# Patient Record
Sex: Female | Born: 1935 | Race: White | Hispanic: No | Marital: Married | Smoking: Never smoker
Health system: Southern US, Community
[De-identification: ages and names within clinical notes are randomized; demographics above are authoritative.]

## PROBLEM LIST (undated history)

## (undated) DIAGNOSIS — I10 Essential (primary) hypertension: Secondary | ICD-10-CM

## (undated) DIAGNOSIS — K219 Gastro-esophageal reflux disease without esophagitis: Secondary | ICD-10-CM

## (undated) DIAGNOSIS — I4891 Unspecified atrial fibrillation: Secondary | ICD-10-CM

## (undated) DIAGNOSIS — M810 Age-related osteoporosis without current pathological fracture: Secondary | ICD-10-CM

## (undated) HISTORY — PX: OTHER SURGICAL HISTORY: SHX169

---

## 2005-01-29 ENCOUNTER — Other Ambulatory Visit: Payer: Self-pay

## 2005-01-29 ENCOUNTER — Emergency Department: Payer: Self-pay | Admitting: Emergency Medicine

## 2007-11-26 ENCOUNTER — Other Ambulatory Visit: Payer: Self-pay

## 2007-11-26 ENCOUNTER — Emergency Department: Payer: Self-pay | Admitting: Emergency Medicine

## 2008-05-29 ENCOUNTER — Encounter: Payer: Self-pay | Admitting: Internal Medicine

## 2009-04-02 ENCOUNTER — Emergency Department: Payer: Self-pay | Admitting: Internal Medicine

## 2010-08-13 ENCOUNTER — Emergency Department: Payer: Self-pay | Admitting: Emergency Medicine

## 2010-08-26 ENCOUNTER — Ambulatory Visit: Payer: Self-pay | Admitting: Gastroenterology

## 2010-08-30 ENCOUNTER — Ambulatory Visit: Payer: Self-pay | Admitting: Gastroenterology

## 2010-09-09 ENCOUNTER — Ambulatory Visit: Payer: Self-pay

## 2010-09-13 ENCOUNTER — Ambulatory Visit: Payer: Self-pay | Admitting: Unknown Physician Specialty

## 2010-09-13 DIAGNOSIS — I10 Essential (primary) hypertension: Secondary | ICD-10-CM

## 2010-09-17 ENCOUNTER — Inpatient Hospital Stay: Payer: Self-pay | Admitting: Internal Medicine

## 2010-09-17 DIAGNOSIS — I471 Supraventricular tachycardia: Secondary | ICD-10-CM

## 2010-09-19 LAB — PATHOLOGY REPORT

## 2010-09-29 ENCOUNTER — Inpatient Hospital Stay: Payer: Self-pay | Admitting: Internal Medicine

## 2010-10-07 LAB — PATHOLOGY REPORT

## 2011-06-20 ENCOUNTER — Ambulatory Visit: Payer: Self-pay

## 2011-06-25 ENCOUNTER — Ambulatory Visit: Payer: Self-pay | Admitting: Unknown Physician Specialty

## 2011-06-30 ENCOUNTER — Ambulatory Visit: Payer: Self-pay | Admitting: Unknown Physician Specialty

## 2011-07-03 LAB — PATHOLOGY REPORT

## 2011-08-21 ENCOUNTER — Ambulatory Visit: Payer: Self-pay | Admitting: Internal Medicine

## 2011-08-26 ENCOUNTER — Ambulatory Visit: Payer: Self-pay | Admitting: Unknown Physician Specialty

## 2011-08-26 LAB — URINALYSIS, COMPLETE
Bacteria: NONE SEEN
Blood: NEGATIVE
Ketone: NEGATIVE
Leukocyte Esterase: NEGATIVE
RBC,UR: NONE SEEN /HPF (ref 0–5)
Squamous Epithelial: <1
WBC UR: 1 /HPF (ref 0–5)

## 2011-08-26 LAB — BASIC METABOLIC PANEL
Anion Gap: 6 — ABNORMAL LOW (ref 7–16)
Calcium, Total: 8.7 mg/dL (ref 8.5–10.1)
Chloride: 105 mmol/L (ref 98–107)
Co2: 30 mmol/L (ref 21–32)
EGFR (African American): >60
Osmolality: 286 (ref 275–301)
Sodium: 141 mmol/L (ref 136–145)

## 2011-08-26 LAB — CBC
MCH: 32 pg (ref 26.0–34.0)
MCHC: 33 g/dL (ref 32.0–36.0)
MCV: 97 fL (ref 80–100)
Platelet: 221 10*3/uL (ref 150–440)
RBC: 3.97 10*6/uL (ref 3.80–5.20)

## 2011-08-28 ENCOUNTER — Ambulatory Visit: Payer: Self-pay | Admitting: Unknown Physician Specialty

## 2011-08-28 LAB — PROTIME-INR: Prothrombin Time: 14.2 secs (ref 11.5–14.7)

## 2011-08-29 LAB — PATHOLOGY REPORT

## 2011-10-27 IMAGING — CR DG CHEST 2V
1 series · 2 of 2 positions shown · non-contrast
Comparison: none

REASON FOR EXAM: pain
COMMENTS:

[Series 1: view not recorded · 0.17mm/px · 2 of 2 slices shown]
[im 1/2]
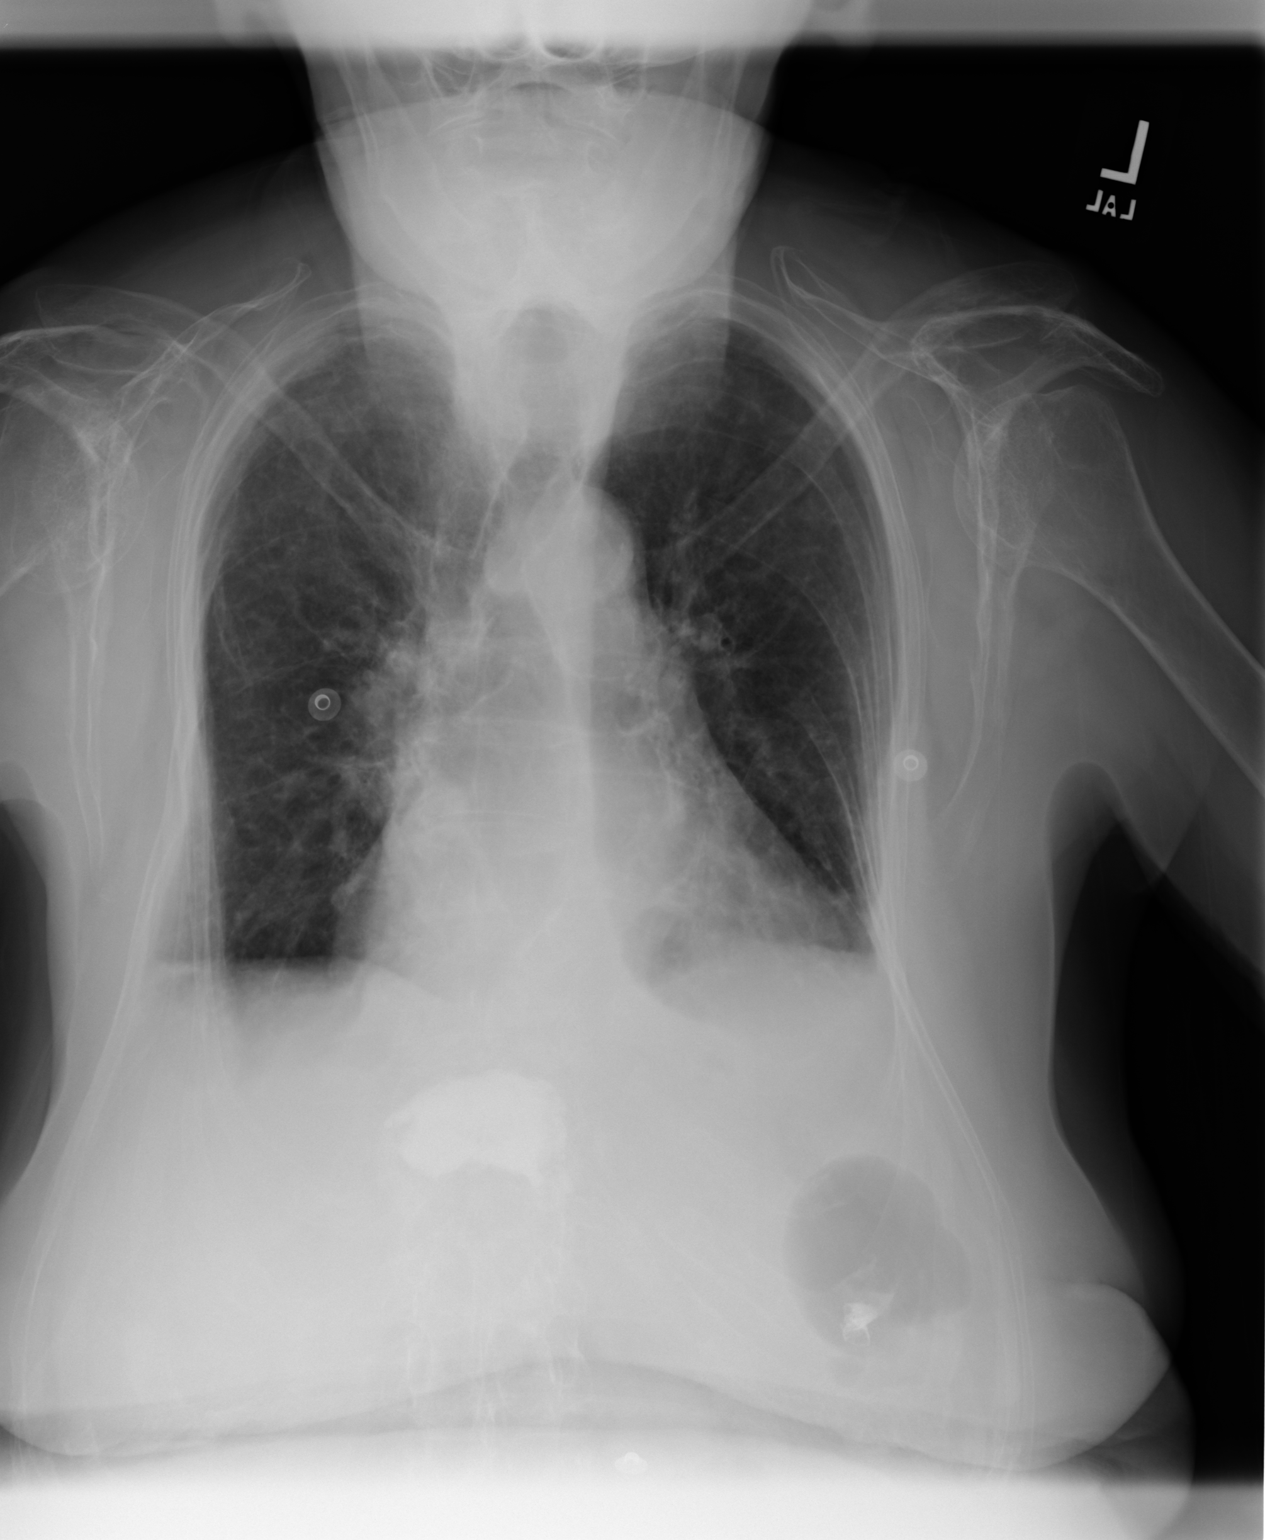
[im 2/2]
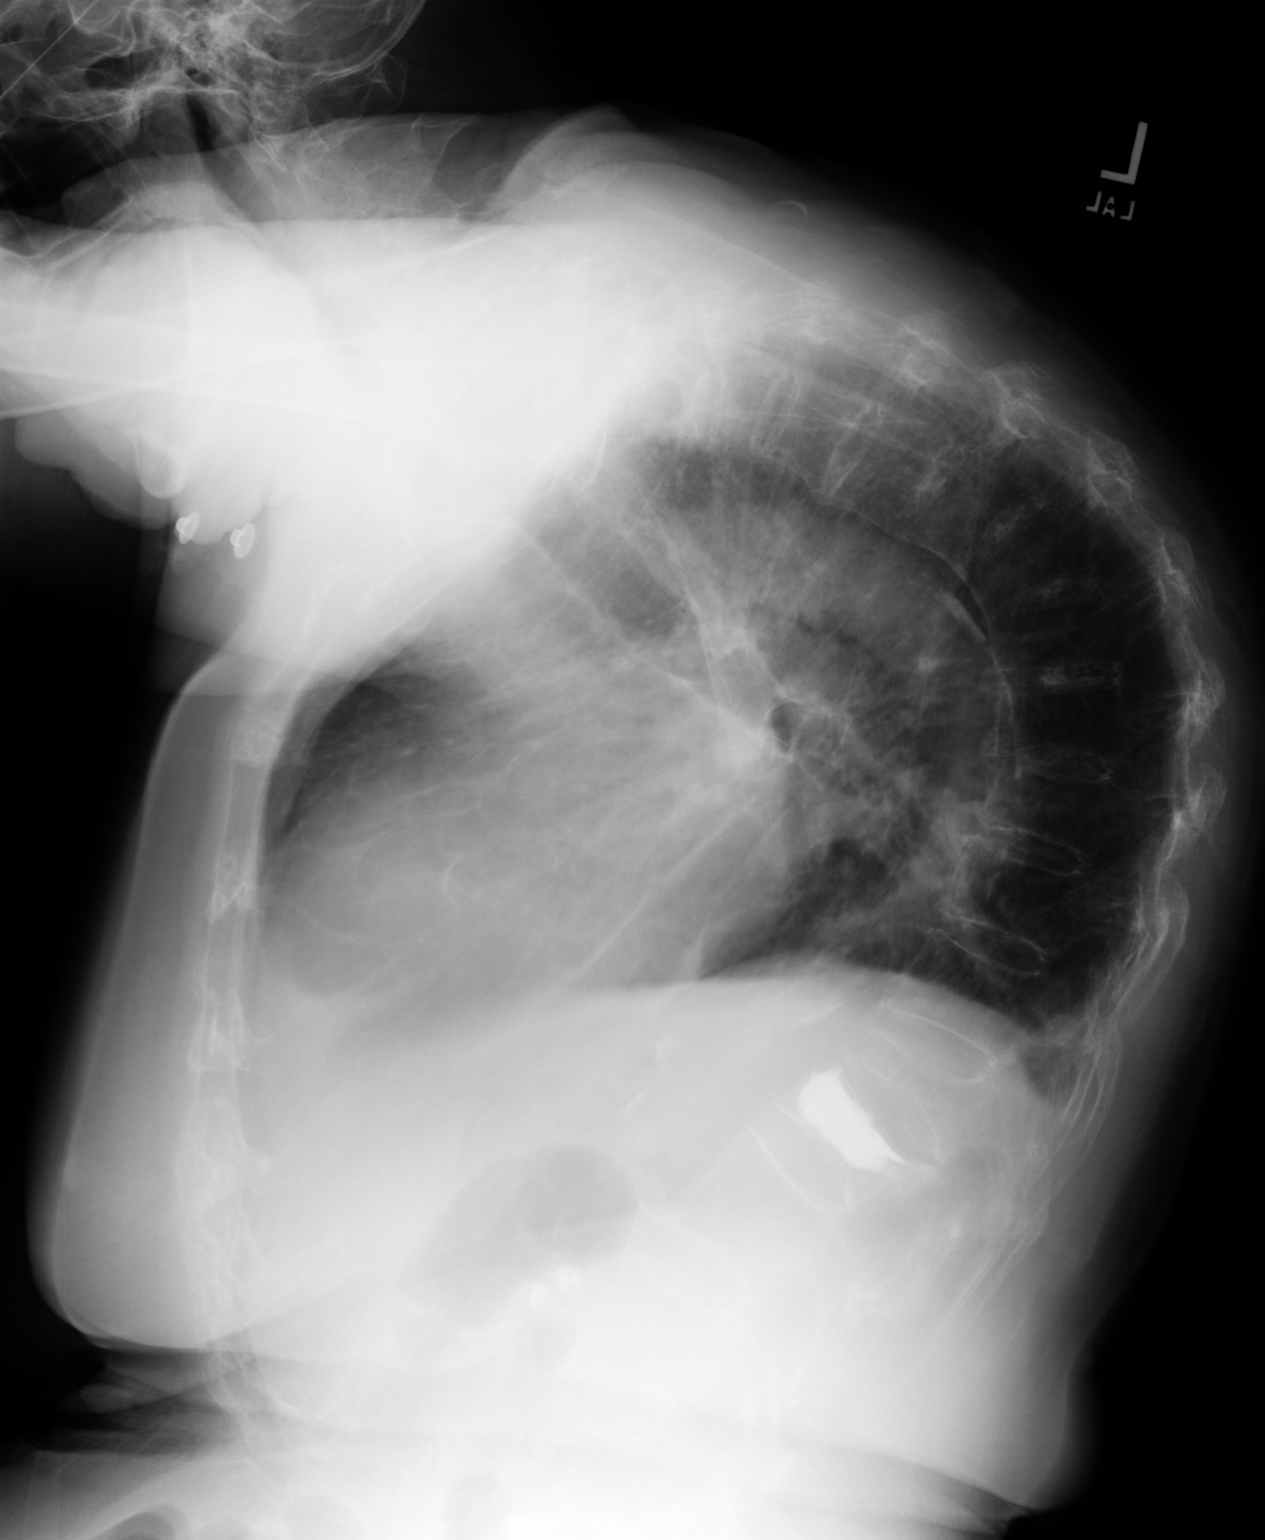

[2 of 2 positions shown; findings below may reference images not displayed]

PROCEDURE:     DXR - DXR CHEST PA (OR AP) AND LATERAL  - September 29, 2010  [DATE]

RESULT:     Comparison is made to prior study dated 09/17/2010.

There is flattening of the hemidiaphragms. The lungs are hyperinflated.
Thickening of the interstitial markings is appreciated. Increased AP
diameter of the chest is appreciated. There is marked kyphosis of the
thoracic spine. The bones are osteopenic. Compression deformity is
identified within the lower thoracic and upper lumbar spine.
IMPRESSION: 1. COPD without evidence of acute cardiopulmonary disease.

## 2011-10-28 IMAGING — CR DG THORACIC SPINE 2-3V
1 series · 2 of 2 positions shown · non-contrast
Comparison: none

REASON FOR EXAM: pain
COMMENTS:

[Series 1: view not recorded · 0.17mm/px · 2 of 2 slices shown]
[im 1/2]
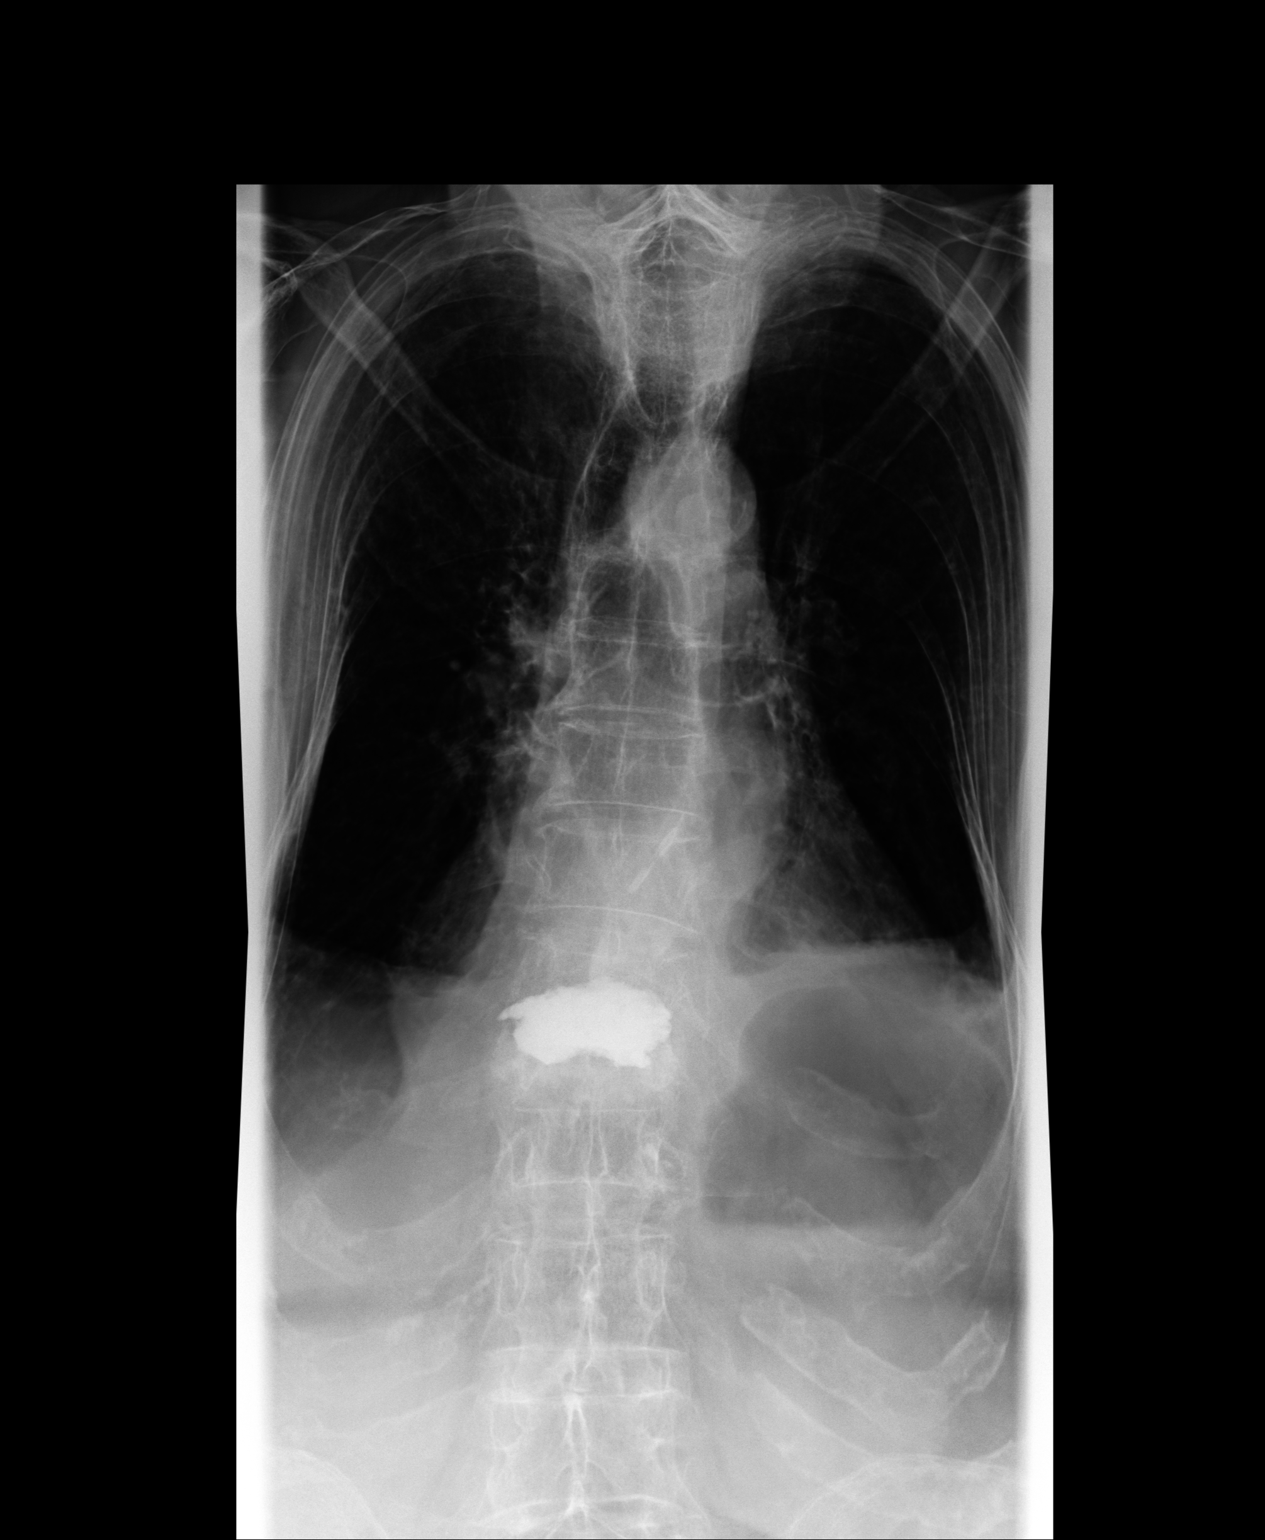
[im 2/2]
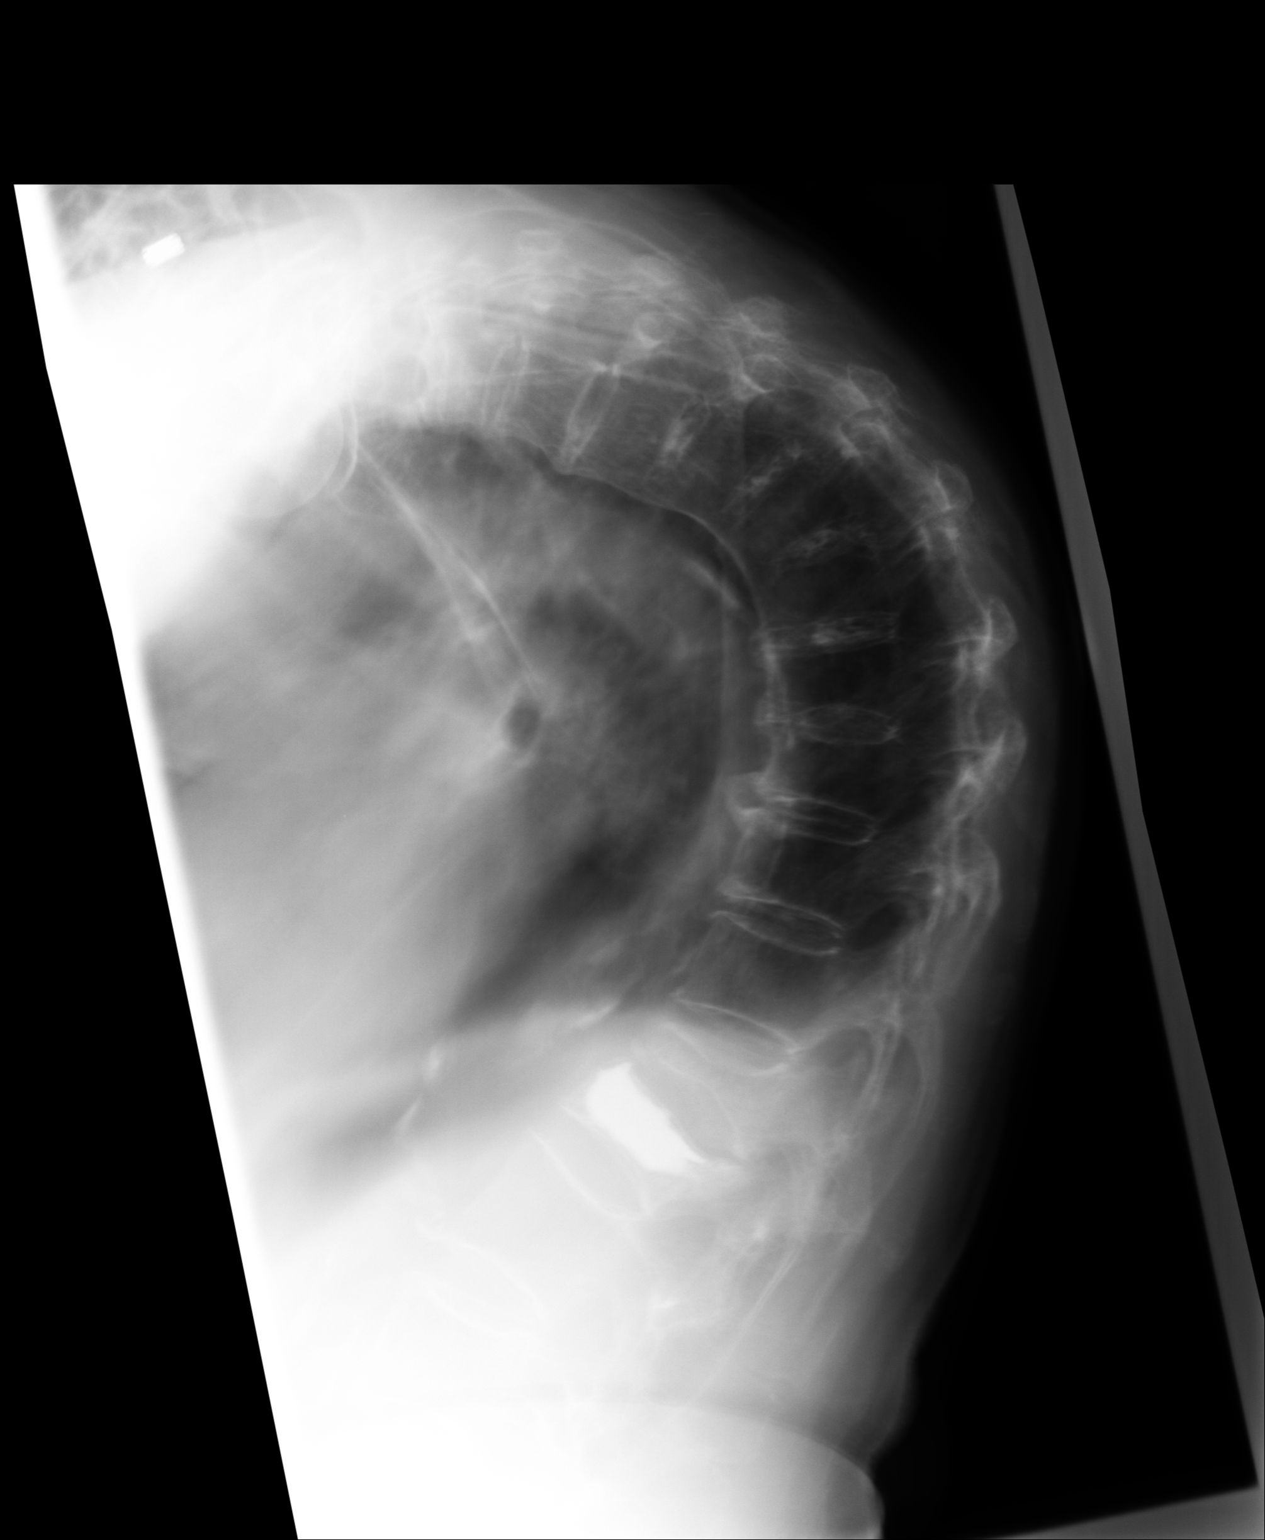

[2 of 2 positions shown; findings below may reference images not displayed]

PROCEDURE:     DXR - DXR THORACIC  AP AND LATERAL  - September 30, 2010  [DATE]

RESULT:     There is a 70% vertebral compression deformity at T12 that has
occurred in the interval since a prior lumbar MR of 09/09/2010. The vertebral
body heights otherwise appear well-maintained. There is a prominent thoracic
kyphosis. There is calcification in the anterior spinal ligament.
IMPRESSION: 1.     There is a severe vertebral compression deformity at T12.

## 2011-10-28 IMAGING — CR DG LUMBAR SPINE 2-3V
1 series · 3 of 3 positions shown · non-contrast
Comparison: none

REASON FOR EXAM: pain
COMMENTS:

[Series 1: view not recorded · 0.17mm/px · 3 of 3 slices shown]
[im 1/3]
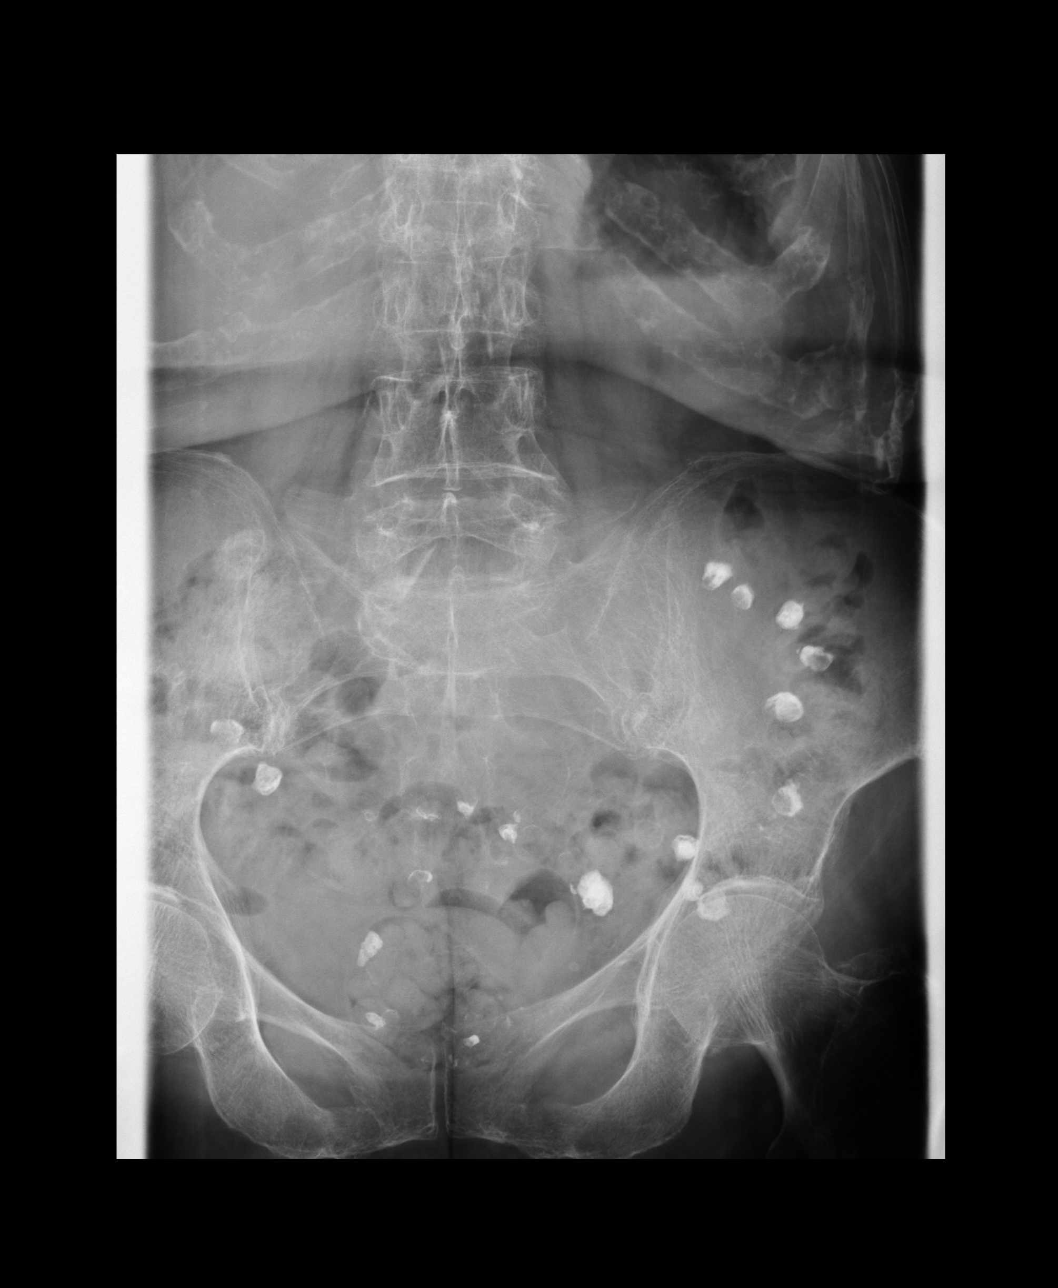
[im 2/3]
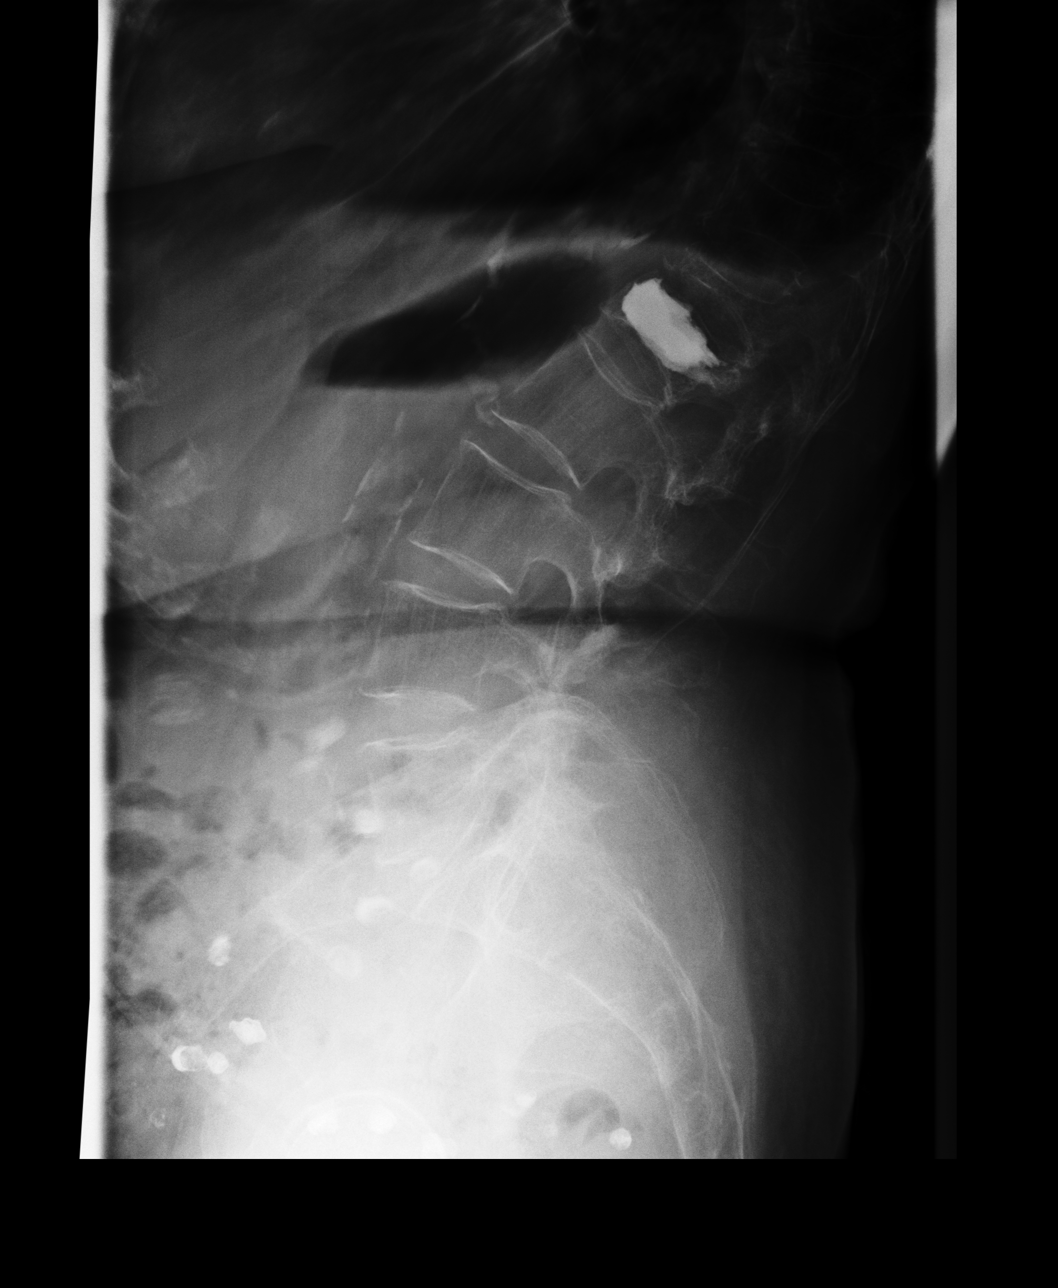
[im 3/3]
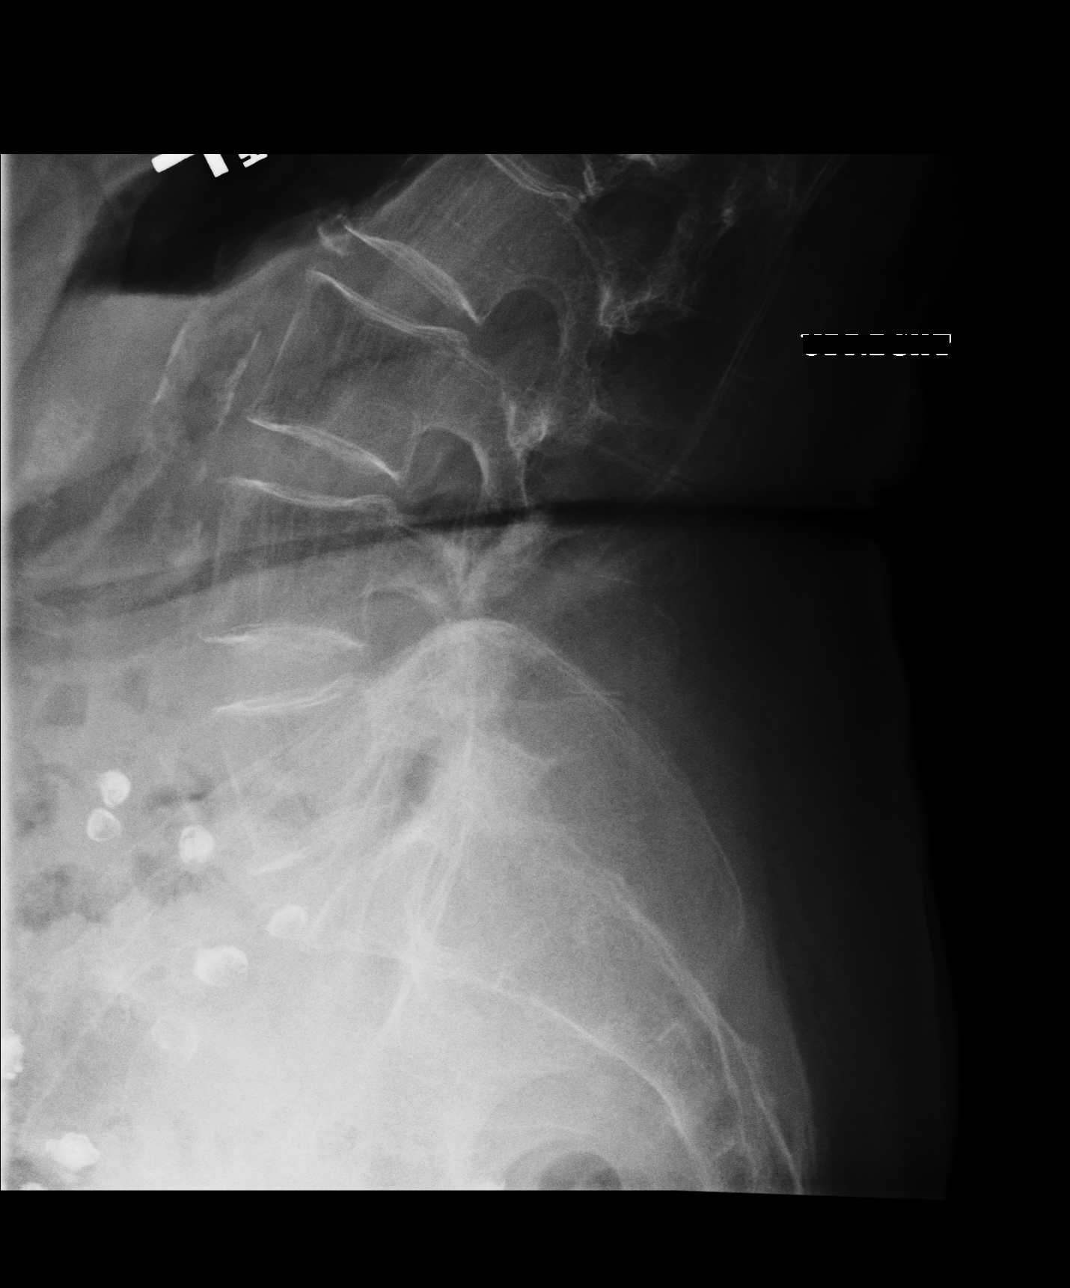

[3 of 3 positions shown; findings below may reference images not displayed]

PROCEDURE:     DXR - DXR LUMBAR SPINE AP AND LATERAL  - September 30, 2010  [DATE]

RESULT:     The patient is status post kyphoplasty at the L1 level. The
vertebral body heights of the lumbar spine otherwise are well-maintained.
The lower thoracic spine is not optimally visualized but there appears to be
a superior vertebral compression deformity at T12 that has occurred in the
interval since the prior MR of 09/09/2010. Confirmation by radiographs of the
lower thoracic spine is recommended. The pedicles of the lumbar spine
inferior to L1 are visualized and are intact. The vertebral body alignment
is normal.
IMPRESSION: 1. The patient is status post prior kyphoplasty at L1.
2. Probable severe vertebral compression deformity at T12. Since bony detail
of the lower thoracic spine is limited, additional radiographic evaluation
of the lower thoracic spine is recommended.

## 2011-11-01 IMAGING — CR DG C-ARM 1-60 MIN
1 series · 1 of 1 positions shown · non-contrast
Comparison: none

REASON FOR EXAM: back pain
COMMENTS:

[lat]
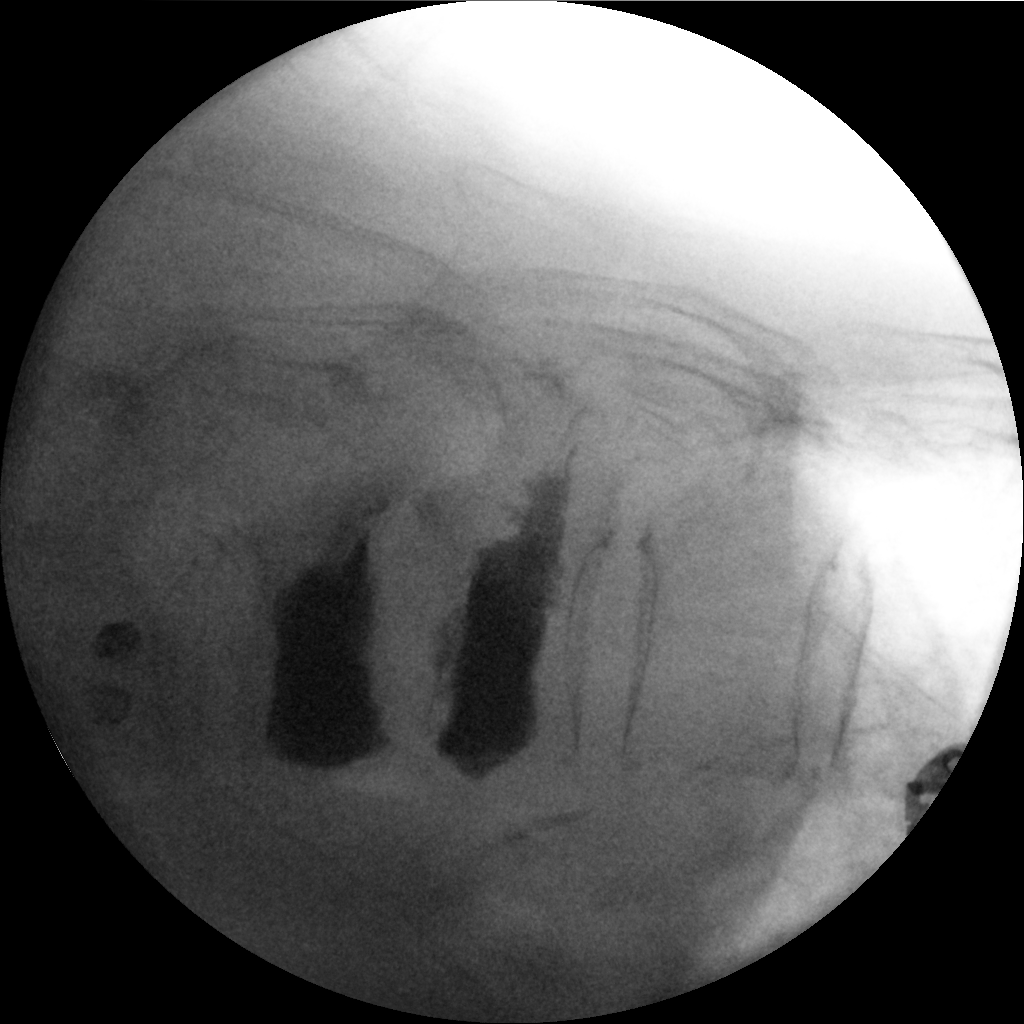

[1 of 1 positions shown; findings below may reference images not displayed]

PROCEDURE:     DXR - DXR C-ARM FOR KYPHOPLASTY  - October 04, 2010  [DATE]

RESULT:     Spot films of the lower thoracic and upper lumbar spine were
obtained. The patient has had prior kyphoplasty at L1. The current spot
films show the patient to have undergone additional kyphoplasty at T12. The
vertebral body alignment remains normal.
IMPRESSION: Please see above.

## 2014-08-09 DIAGNOSIS — I482 Chronic atrial fibrillation: Secondary | ICD-10-CM | POA: Diagnosis not present

## 2014-08-09 DIAGNOSIS — M810 Age-related osteoporosis without current pathological fracture: Secondary | ICD-10-CM | POA: Diagnosis not present

## 2014-09-07 DIAGNOSIS — Z7901 Long term (current) use of anticoagulants: Secondary | ICD-10-CM | POA: Diagnosis not present

## 2014-10-04 DIAGNOSIS — I482 Chronic atrial fibrillation: Secondary | ICD-10-CM | POA: Diagnosis not present

## 2014-11-24 DIAGNOSIS — I482 Chronic atrial fibrillation: Secondary | ICD-10-CM | POA: Diagnosis not present

## 2014-11-24 DIAGNOSIS — M810 Age-related osteoporosis without current pathological fracture: Secondary | ICD-10-CM | POA: Diagnosis not present

## 2014-11-26 NOTE — Op Note (Signed)
PATIENT NAME:  Kendra BurnsKECK, [name] MR#:  [id number] DATE OF BIRTH:  1923-05-08  DATE OF PROCEDURE:  08/28/2011  PREOPERATIVE DIAGNOSIS: Compression fracture T10.   POSTOPERATIVE DIAGNOSIS: Compression fracture T10 and T11.   PROCEDURES:  1. Kyphoplasty with biopsy injection of PMMA cement T10.  2. Kyphoplasty with biopsy and injection of PMMA cement T11.  3. Fluoroscopically placed trocars and guidance of trocar placement and radiological interpretation of such.   SURGEON: Ruthann CancerJames Errica Dutil, M.D.   ASSISTANT: None.   ANESTHESIA: MAC.   ESTIMATED BLOOD LOSS: Negligible.  COMPLICATIONS: None.  SPECIMEN SENT: Tissue from T10 and T11.   BRIEF CLINICAL NOTE AND PATHOLOGY: The patient had had prior kyphoplasties and had done very well. She developed recurrent severe back pain with evidence of a kyphoplasty at T10. MRI confirmed this. Options, risks, and benefits were discussed and due to her severe pain and difficulty tolerating pain medications, we have proceeded with kyphoplasty. With further examination of the patient on the fluoroscopy table in the Operating Room, there appeared to be some collapse at T11 as well and it was felt to be unsafe to leave this vertebral body isolated between two cemented vertebrae. Thus, elected to proceed with kyphoplasty at T11 as well.   Both vertebral bodies only had a small amount of biopsy obtainable.   DESCRIPTION OF PROCEDURE: Preop antibiotics, adequate MAC anesthesia, prone position, all prominences well padded. Routine prepping and draping. Appropriate time-out was called. The T10 and T11 vertebral bodies were appropriately isolated and pedicles were identified. They were then instrumented in routine fashion through the right side using fluoroscopic guidance. The cannula was carefully advanced through the pedicle into the vertebral body and specimen was obtained. The biopsy device was then removed. The balloon was inserted. It was inflated nicely at each level.    The balloons were then removed and cement was injected when it was of the appropriate consistency filling the vertebral bodies nicely.   The incisions were closed with Monocryl. Band-Aids were applied. The patient was awakened and taken to the PAC-U having tolerated the procedure well.   ____________________________ Winn JockJames C. Gerrit Heckaliff, MD jcc:ap D: 08/29/2011 13:22:36 ET T: 08/29/2011 13:30:41 ET JOB#: [id number]  cc: Winn JockJames C. Gerrit Heckaliff, MD, <Dictator> Winn JockJAMES C Ridwan Bondy MD ELECTRONICALLY SIGNED 08/31/2011 14:44

## 2014-12-01 DIAGNOSIS — Z0000 Encounter for general adult medical examination without abnormal findings: Secondary | ICD-10-CM | POA: Diagnosis not present

## 2014-12-01 DIAGNOSIS — I482 Chronic atrial fibrillation: Secondary | ICD-10-CM | POA: Diagnosis not present

## 2014-12-01 DIAGNOSIS — M4850XS Collapsed vertebra, not elsewhere classified, site unspecified, sequela of fracture: Secondary | ICD-10-CM | POA: Diagnosis not present

## 2014-12-29 DIAGNOSIS — I482 Chronic atrial fibrillation: Secondary | ICD-10-CM | POA: Diagnosis not present

## 2015-01-25 DIAGNOSIS — Z7901 Long term (current) use of anticoagulants: Secondary | ICD-10-CM | POA: Diagnosis not present

## 2015-02-12 DIAGNOSIS — H2511 Age-related nuclear cataract, right eye: Secondary | ICD-10-CM | POA: Diagnosis not present

## 2015-02-23 DIAGNOSIS — Z7901 Long term (current) use of anticoagulants: Secondary | ICD-10-CM | POA: Diagnosis not present

## 2015-02-27 DIAGNOSIS — M810 Age-related osteoporosis without current pathological fracture: Secondary | ICD-10-CM | POA: Diagnosis not present

## 2015-03-27 DIAGNOSIS — I482 Chronic atrial fibrillation: Secondary | ICD-10-CM | POA: Diagnosis not present

## 2015-03-27 DIAGNOSIS — M810 Age-related osteoporosis without current pathological fracture: Secondary | ICD-10-CM | POA: Diagnosis not present

## 2015-03-27 DIAGNOSIS — Z79899 Other long term (current) drug therapy: Secondary | ICD-10-CM | POA: Diagnosis not present

## 2015-04-26 DIAGNOSIS — Z7901 Long term (current) use of anticoagulants: Secondary | ICD-10-CM | POA: Diagnosis not present

## 2015-05-14 DIAGNOSIS — H02053 Trichiasis without entropian right eye, unspecified eyelid: Secondary | ICD-10-CM | POA: Diagnosis not present

## 2015-05-14 DIAGNOSIS — H353131 Nonexudative age-related macular degeneration, bilateral, early dry stage: Secondary | ICD-10-CM | POA: Diagnosis not present

## 2015-05-24 DIAGNOSIS — Z7901 Long term (current) use of anticoagulants: Secondary | ICD-10-CM | POA: Diagnosis not present

## 2015-06-07 DIAGNOSIS — Z7901 Long term (current) use of anticoagulants: Secondary | ICD-10-CM | POA: Diagnosis not present

## 2015-06-11 DIAGNOSIS — J301 Allergic rhinitis due to pollen: Secondary | ICD-10-CM | POA: Diagnosis not present

## 2015-06-11 DIAGNOSIS — M542 Cervicalgia: Secondary | ICD-10-CM | POA: Diagnosis not present

## 2015-06-25 DIAGNOSIS — Z7901 Long term (current) use of anticoagulants: Secondary | ICD-10-CM | POA: Diagnosis not present

## 2015-07-17 DIAGNOSIS — Z7901 Long term (current) use of anticoagulants: Secondary | ICD-10-CM | POA: Diagnosis not present

## 2015-08-02 DIAGNOSIS — Z79899 Other long term (current) drug therapy: Secondary | ICD-10-CM | POA: Diagnosis not present

## 2015-08-02 DIAGNOSIS — I482 Chronic atrial fibrillation: Secondary | ICD-10-CM | POA: Diagnosis not present

## 2015-08-02 DIAGNOSIS — H938X3 Other specified disorders of ear, bilateral: Secondary | ICD-10-CM | POA: Diagnosis not present

## 2015-08-02 DIAGNOSIS — H6123 Impacted cerumen, bilateral: Secondary | ICD-10-CM | POA: Diagnosis not present

## 2015-08-16 DIAGNOSIS — Z7901 Long term (current) use of anticoagulants: Secondary | ICD-10-CM | POA: Diagnosis not present

## 2015-09-17 DIAGNOSIS — I482 Chronic atrial fibrillation: Secondary | ICD-10-CM | POA: Diagnosis not present

## 2015-09-28 DIAGNOSIS — M7021 Olecranon bursitis, right elbow: Secondary | ICD-10-CM | POA: Diagnosis not present

## 2015-10-16 DIAGNOSIS — I482 Chronic atrial fibrillation: Secondary | ICD-10-CM | POA: Diagnosis not present

## 2015-10-23 DIAGNOSIS — J40 Bronchitis, not specified as acute or chronic: Secondary | ICD-10-CM | POA: Diagnosis not present

## 2015-10-23 DIAGNOSIS — I482 Chronic atrial fibrillation: Secondary | ICD-10-CM | POA: Diagnosis not present

## 2015-10-23 DIAGNOSIS — M7021 Olecranon bursitis, right elbow: Secondary | ICD-10-CM | POA: Diagnosis not present

## 2015-11-22 DIAGNOSIS — I482 Chronic atrial fibrillation: Secondary | ICD-10-CM | POA: Diagnosis not present

## 2015-11-22 DIAGNOSIS — Z79899 Other long term (current) drug therapy: Secondary | ICD-10-CM | POA: Diagnosis not present

## 2015-11-30 DIAGNOSIS — H6121 Impacted cerumen, right ear: Secondary | ICD-10-CM | POA: Diagnosis not present

## 2015-11-30 DIAGNOSIS — Z0000 Encounter for general adult medical examination without abnormal findings: Secondary | ICD-10-CM | POA: Diagnosis not present

## 2015-11-30 DIAGNOSIS — I4891 Unspecified atrial fibrillation: Secondary | ICD-10-CM | POA: Diagnosis not present

## 2015-12-26 DIAGNOSIS — I482 Chronic atrial fibrillation: Secondary | ICD-10-CM | POA: Diagnosis not present

## 2015-12-27 DIAGNOSIS — L03119 Cellulitis of unspecified part of limb: Secondary | ICD-10-CM | POA: Diagnosis not present

## 2015-12-27 DIAGNOSIS — I5021 Acute systolic (congestive) heart failure: Secondary | ICD-10-CM | POA: Diagnosis not present

## 2016-01-07 DIAGNOSIS — M810 Age-related osteoporosis without current pathological fracture: Secondary | ICD-10-CM | POA: Diagnosis not present

## 2016-01-07 DIAGNOSIS — I5021 Acute systolic (congestive) heart failure: Secondary | ICD-10-CM | POA: Diagnosis not present

## 2016-01-07 DIAGNOSIS — I4891 Unspecified atrial fibrillation: Secondary | ICD-10-CM | POA: Diagnosis not present

## 2016-01-25 DIAGNOSIS — I5021 Acute systolic (congestive) heart failure: Secondary | ICD-10-CM | POA: Diagnosis not present

## 2016-02-06 DIAGNOSIS — I482 Chronic atrial fibrillation: Secondary | ICD-10-CM | POA: Diagnosis not present

## 2016-02-21 DIAGNOSIS — M810 Age-related osteoporosis without current pathological fracture: Secondary | ICD-10-CM | POA: Diagnosis not present

## 2016-03-04 DIAGNOSIS — M810 Age-related osteoporosis without current pathological fracture: Secondary | ICD-10-CM | POA: Diagnosis not present

## 2016-03-04 DIAGNOSIS — I482 Chronic atrial fibrillation: Secondary | ICD-10-CM | POA: Diagnosis not present

## 2016-03-31 DIAGNOSIS — I5021 Acute systolic (congestive) heart failure: Secondary | ICD-10-CM | POA: Diagnosis not present

## 2016-03-31 DIAGNOSIS — I482 Chronic atrial fibrillation: Secondary | ICD-10-CM | POA: Diagnosis not present

## 2016-03-31 DIAGNOSIS — I4891 Unspecified atrial fibrillation: Secondary | ICD-10-CM | POA: Diagnosis not present

## 2016-04-25 DIAGNOSIS — H6123 Impacted cerumen, bilateral: Secondary | ICD-10-CM | POA: Diagnosis not present

## 2016-04-25 DIAGNOSIS — H6982 Other specified disorders of Eustachian tube, left ear: Secondary | ICD-10-CM | POA: Diagnosis not present

## 2016-04-25 DIAGNOSIS — R51 Headache: Secondary | ICD-10-CM | POA: Diagnosis not present

## 2016-04-25 DIAGNOSIS — H9202 Otalgia, left ear: Secondary | ICD-10-CM | POA: Diagnosis not present

## 2016-04-25 DIAGNOSIS — I4891 Unspecified atrial fibrillation: Secondary | ICD-10-CM | POA: Diagnosis not present

## 2016-05-13 DIAGNOSIS — H6123 Impacted cerumen, bilateral: Secondary | ICD-10-CM | POA: Diagnosis not present

## 2016-05-13 DIAGNOSIS — R1314 Dysphagia, pharyngoesophageal phase: Secondary | ICD-10-CM | POA: Diagnosis not present

## 2016-05-28 DIAGNOSIS — I482 Chronic atrial fibrillation: Secondary | ICD-10-CM | POA: Diagnosis not present

## 2016-05-30 DIAGNOSIS — I5041 Acute combined systolic (congestive) and diastolic (congestive) heart failure: Secondary | ICD-10-CM | POA: Diagnosis not present

## 2016-05-30 DIAGNOSIS — I200 Unstable angina: Secondary | ICD-10-CM | POA: Diagnosis not present

## 2016-06-04 DIAGNOSIS — I200 Unstable angina: Secondary | ICD-10-CM | POA: Diagnosis not present

## 2016-06-04 DIAGNOSIS — I5041 Acute combined systolic (congestive) and diastolic (congestive) heart failure: Secondary | ICD-10-CM | POA: Diagnosis not present

## 2016-06-09 DIAGNOSIS — I5041 Acute combined systolic (congestive) and diastolic (congestive) heart failure: Secondary | ICD-10-CM | POA: Diagnosis not present

## 2016-06-09 DIAGNOSIS — I200 Unstable angina: Secondary | ICD-10-CM | POA: Diagnosis not present

## 2016-07-01 DIAGNOSIS — I482 Chronic atrial fibrillation: Secondary | ICD-10-CM | POA: Diagnosis not present

## 2016-07-25 DIAGNOSIS — I4891 Unspecified atrial fibrillation: Secondary | ICD-10-CM | POA: Diagnosis not present

## 2016-07-25 DIAGNOSIS — R300 Dysuria: Secondary | ICD-10-CM | POA: Diagnosis not present

## 2016-07-25 DIAGNOSIS — I5021 Acute systolic (congestive) heart failure: Secondary | ICD-10-CM | POA: Diagnosis not present

## 2016-07-29 ENCOUNTER — Emergency Department: Payer: Commercial Managed Care - HMO

## 2016-07-29 ENCOUNTER — Encounter: Payer: Self-pay | Admitting: Emergency Medicine

## 2016-07-29 ENCOUNTER — Observation Stay
Admission: EM | Admit: 2016-07-29 | Discharge: 2016-07-31 | Disposition: A | Discharge status: Skilled Nursing Facility | Payer: Commercial Managed Care - HMO | Attending: Internal Medicine | Admitting: Internal Medicine

## 2016-07-29 DIAGNOSIS — M25559 Pain in unspecified hip: Secondary | ICD-10-CM | POA: Diagnosis not present

## 2016-07-29 DIAGNOSIS — I5032 Chronic diastolic (congestive) heart failure: Secondary | ICD-10-CM | POA: Diagnosis not present

## 2016-07-29 DIAGNOSIS — Y92009 Unspecified place in unspecified non-institutional (private) residence as the place of occurrence of the external cause: Secondary | ICD-10-CM | POA: Insufficient documentation

## 2016-07-29 DIAGNOSIS — S32512A Fracture of superior rim of left pubis, initial encounter for closed fracture: Principal | ICD-10-CM | POA: Insufficient documentation

## 2016-07-29 DIAGNOSIS — R0602 Shortness of breath: Secondary | ICD-10-CM

## 2016-07-29 DIAGNOSIS — M6281 Muscle weakness (generalized): Secondary | ICD-10-CM

## 2016-07-29 DIAGNOSIS — W19XXXA Unspecified fall, initial encounter: Secondary | ICD-10-CM | POA: Diagnosis not present

## 2016-07-29 DIAGNOSIS — W010XXA Fall on same level from slipping, tripping and stumbling without subsequent striking against object, initial encounter: Secondary | ICD-10-CM | POA: Diagnosis not present

## 2016-07-29 DIAGNOSIS — Z7982 Long term (current) use of aspirin: Secondary | ICD-10-CM | POA: Diagnosis not present

## 2016-07-29 DIAGNOSIS — Z79899 Other long term (current) drug therapy: Secondary | ICD-10-CM | POA: Diagnosis not present

## 2016-07-29 DIAGNOSIS — R52 Pain, unspecified: Secondary | ICD-10-CM | POA: Diagnosis not present

## 2016-07-29 DIAGNOSIS — I10 Essential (primary) hypertension: Secondary | ICD-10-CM | POA: Diagnosis not present

## 2016-07-29 DIAGNOSIS — M810 Age-related osteoporosis without current pathological fracture: Secondary | ICD-10-CM | POA: Insufficient documentation

## 2016-07-29 DIAGNOSIS — I482 Chronic atrial fibrillation: Secondary | ICD-10-CM | POA: Insufficient documentation

## 2016-07-29 DIAGNOSIS — E559 Vitamin D deficiency, unspecified: Secondary | ICD-10-CM | POA: Diagnosis not present

## 2016-07-29 DIAGNOSIS — Z7951 Long term (current) use of inhaled steroids: Secondary | ICD-10-CM | POA: Insufficient documentation

## 2016-07-29 DIAGNOSIS — S32502A Unspecified fracture of left pubis, initial encounter for closed fracture: Secondary | ICD-10-CM | POA: Diagnosis not present

## 2016-07-29 DIAGNOSIS — S329XXA Fracture of unspecified parts of lumbosacral spine and pelvis, initial encounter for closed fracture: Secondary | ICD-10-CM | POA: Diagnosis present

## 2016-07-29 DIAGNOSIS — S32592A Other specified fracture of left pubis, initial encounter for closed fracture: Secondary | ICD-10-CM | POA: Insufficient documentation

## 2016-07-29 DIAGNOSIS — R300 Dysuria: Secondary | ICD-10-CM | POA: Diagnosis not present

## 2016-07-29 DIAGNOSIS — I4891 Unspecified atrial fibrillation: Secondary | ICD-10-CM | POA: Diagnosis not present

## 2016-07-29 DIAGNOSIS — I5021 Acute systolic (congestive) heart failure: Secondary | ICD-10-CM | POA: Diagnosis not present

## 2016-07-29 DIAGNOSIS — R2689 Other abnormalities of gait and mobility: Secondary | ICD-10-CM

## 2016-07-29 DIAGNOSIS — Z7901 Long term (current) use of anticoagulants: Secondary | ICD-10-CM | POA: Diagnosis not present

## 2016-07-29 HISTORY — DX: Gastro-esophageal reflux disease without esophagitis: K21.9

## 2016-07-29 HISTORY — DX: Age-related osteoporosis without current pathological fracture: M81.0

## 2016-07-29 HISTORY — DX: Essential (primary) hypertension: I10

## 2016-07-29 HISTORY — DX: Unspecified atrial fibrillation: I48.91

## 2016-07-29 LAB — CBC
HCT: 36.9 % (ref 35.0–47.0)
Hemoglobin: 12.2 g/dL (ref 12.0–16.0)
MCH: 29.8 pg (ref 26.0–34.0)
MCHC: 33.1 g/dL (ref 32.0–36.0)
MCV: 89.8 fL (ref 80.0–100.0)
PLATELETS: 183 10*3/uL (ref 150–440)
RBC: 4.11 MIL/uL (ref 3.80–5.20)
RDW: 14.6 % — AB (ref 11.5–14.5)
WBC: 14.4 10*3/uL — AB (ref 3.6–11.0)

## 2016-07-29 LAB — BASIC METABOLIC PANEL
Anion gap: 10 (ref 5–15)
BUN: 34 mg/dL — AB (ref 6–20)
CALCIUM: 9.4 mg/dL (ref 8.9–10.3)
CO2: 27 mmol/L (ref 22–32)
Chloride: 97 mmol/L — ABNORMAL LOW (ref 101–111)
Creatinine, Ser: 1.28 mg/dL — ABNORMAL HIGH (ref 0.44–1.00)
GFR calc Af Amer: 41 mL/min — ABNORMAL LOW (ref >60)
GFR, EST NON AFRICAN AMERICAN: 35 mL/min — AB (ref >60)
GLUCOSE: 140 mg/dL — AB (ref 65–99)
Potassium: 4.3 mmol/L (ref 3.5–5.1)
Sodium: 134 mmol/L — ABNORMAL LOW (ref 135–145)

## 2016-07-29 LAB — URINALYSIS, COMPLETE (UACMP) WITH MICROSCOPIC
Bacteria, UA: NONE SEEN
Bilirubin Urine: NEGATIVE
GLUCOSE, UA: NEGATIVE mg/dL
Hgb urine dipstick: NEGATIVE
Ketones, ur: NEGATIVE mg/dL
LEUKOCYTES UA: NEGATIVE
NITRITE: NEGATIVE
PH: 5 (ref 5.0–8.0)
PROTEIN: 30 mg/dL — AB
SPECIFIC GRAVITY, URINE: 1.021 (ref 1.005–1.030)
Squamous Epithelial / LPF: NONE SEEN

## 2016-07-29 LAB — TYPE AND SCREEN
ABO/RH(D): O POS
Antibody Screen: NEGATIVE

## 2016-07-29 LAB — PROTIME-INR
INR: 2.1
Prothrombin Time: 23.9 seconds — ABNORMAL HIGH (ref 11.4–15.2)

## 2016-07-29 MED ORDER — MORPHINE SULFATE (PF) 4 MG/ML IV SOLN
4.0000 mg | Freq: Once | INTRAVENOUS | Status: AC
  Administered 2016-07-29: 4 mg via INTRAVENOUS
  Filled 2016-07-29: qty 1

## 2016-07-29 MED ORDER — ENOXAPARIN SODIUM 40 MG/0.4ML ~~LOC~~ SOLN: 40.0000 mg | SUBCUTANEOUS | Status: DC

## 2016-07-29 MED ORDER — ACETAMINOPHEN 325 MG PO TABS
650.0000 mg | ORAL_TABLET | Freq: Four times a day (QID) | ORAL | Status: DC | PRN
  Administered 2016-07-31: 650 mg via ORAL
  Filled 2016-07-29: qty 2

## 2016-07-29 MED ORDER — ONDANSETRON HCL 4 MG/2ML IJ SOLN: 4.0000 mg | Freq: Four times a day (QID) | INTRAMUSCULAR | Status: DC | PRN

## 2016-07-29 MED ORDER — METOPROLOL TARTRATE 25 MG PO TABS
25.0000 mg | ORAL_TABLET | Freq: Two times a day (BID) | ORAL | Status: DC
  Administered 2016-07-29 – 2016-07-31 (×4): 25 mg via ORAL
  Filled 2016-07-29 (×4): qty 1

## 2016-07-29 MED ORDER — DOCUSATE SODIUM 100 MG PO CAPS
100.0000 mg | ORAL_CAPSULE | Freq: Two times a day (BID) | ORAL | Status: DC
  Administered 2016-07-30 – 2016-07-31 (×4): 100 mg via ORAL
  Filled 2016-07-29 (×5): qty 1

## 2016-07-29 MED ORDER — MORPHINE SULFATE (PF) 2 MG/ML IV SOLN: 2.0000 mg | Freq: Once | INTRAVENOUS | Status: DC

## 2016-07-29 MED ORDER — POLYETHYLENE GLYCOL 3350 17 G PO PACK
17.0000 g | PACK | Freq: Every day | ORAL | Status: DC | PRN
  Administered 2016-07-31: 17 g via ORAL
  Filled 2016-07-29: qty 1

## 2016-07-29 MED ORDER — ONDANSETRON HCL 4 MG/2ML IJ SOLN
4.0000 mg | Freq: Once | INTRAMUSCULAR | Status: AC
  Administered 2016-07-29: 4 mg via INTRAVENOUS
  Filled 2016-07-29: qty 2

## 2016-07-29 MED ORDER — DILTIAZEM HCL 30 MG PO TABS
30.0000 mg | ORAL_TABLET | Freq: Three times a day (TID) | ORAL | Status: DC
  Administered 2016-07-29 – 2016-07-31 (×4): 30 mg via ORAL
  Filled 2016-07-29 (×6): qty 1

## 2016-07-29 MED ORDER — DILTIAZEM HCL 25 MG/5ML IV SOLN
10.0000 mg | Freq: Once | INTRAVENOUS | Status: AC
Start: 2016-07-29 — End: 2016-07-29
  Administered 2016-07-29: 10 mg via INTRAVENOUS
  Filled 2016-07-29: qty 5

## 2016-07-29 MED ORDER — ENOXAPARIN SODIUM 40 MG/0.4ML ~~LOC~~ SOLN: 30.0000 mg | SUBCUTANEOUS | Status: DC

## 2016-07-29 MED ORDER — ACETAMINOPHEN 650 MG RE SUPP: 650.0000 mg | Freq: Four times a day (QID) | RECTAL | Status: DC | PRN

## 2016-07-29 MED ORDER — WARFARIN - PHARMACIST DOSING INPATIENT: Freq: Every day | Status: DC

## 2016-07-29 MED ORDER — OXYCODONE HCL 5 MG PO TABS
5.0000 mg | ORAL_TABLET | ORAL | Status: DC | PRN
  Administered 2016-07-29 – 2016-07-31 (×5): 5 mg via ORAL
  Filled 2016-07-29 (×5): qty 1

## 2016-07-29 MED ORDER — ONDANSETRON HCL 4 MG PO TABS: 4.0000 mg | ORAL_TABLET | Freq: Four times a day (QID) | ORAL | Status: DC | PRN

## 2016-07-29 MED ORDER — WARFARIN SODIUM 1 MG PO TABS
2.0000 mg | ORAL_TABLET | Freq: Once | ORAL | Status: AC
  Administered 2016-07-29: 2 mg via ORAL
  Filled 2016-07-29: qty 2

## 2016-07-29 NOTE — ED Provider Notes (Signed)
[facility] Emergency Department Provider Note   ____________________________________________    I have reviewed the triage vital signs and the nursing notes.   HISTORY  Chief Complaint Fall     HPI Kendra Thomas is a 82 y.o. female who presents after a fallthis morning. Patient reports she lost her balance and fell onto her left leg. She complains of pain in her left groin. She denies other injuries beside a mild abrasion on her right elbow. She denies back pain. No head injury. No dizziness palpitations or chest pain. No shortness of breath. Abdominal pain nausea or vomiting   Past Medical History:  Diagnosis Date  . Atrial fibrillation [facility](HCC)     Patient Active Problem List   Diagnosis Date Noted  . Pelvic fracture (HCC) 07/29/2016    Past Surgical History:  Procedure Laterality Date  . BACK SURGERY      Prior to Admission medications   Not on File     Allergies Penicillins  Family History  Problem Relation Age of Onset  . Diabetes Neg Hx     Social History Social History  Substance Use Topics  . Smoking status: Never Smoker  . Smokeless tobacco: Never Used  . Alcohol use No    Review of Systems  Constitutional: No fever/chills Eyes: No visual changes.  ENT: NoNeck pain Cardiovascular: Denies chest pain. Respiratory: Denies shortness of breath. Gastrointestinal: No abdominal pain.  No nausea, no vomiting.   Genitourinary: Negative for dysuria. Musculoskeletal: Negative for back pain. Positive for left groin pain Skin: Negative for rash. Neurological: Negative for headaches or focal weakness  10-point ROS otherwise negative.  ____________________________________________   PHYSICAL EXAM:  VITAL SIGNS: ED Triage Vitals  Enc Vitals Group     BP 07/29/16 1228 106/86     Pulse Rate 07/29/16 1228 (!) 125     Resp 07/29/16 1228 (!) 23     Temp --      Temp src --      SpO2 07/29/16 1228 100 %     Weight  07/29/16 1117 130 lb (59 kg)     Height 07/29/16 1117 5' (1.524 m)     Head Circumference --      Peak Flow --      Pain Score 07/29/16 1117 5     Pain Loc --      Pain Edu? --      Excl. in GC? --     Constitutional: Alert and oriented. No acute distress.  Eyes: Conjunctivae are normal.  Head: Atraumatic. Nose: No congestion/rhinnorhea. Mouth/Throat: Mucous membranes are moist.    Cardiovascular: Normal rate, regular rhythm. Grossly normal heart sounds.  Good peripheral circulation. Respiratory: Normal respiratory effort.  No retractions. Lungs CTAB. Gastrointestinal: Soft and nontender. No distention.  No CVA tenderness. Genitourinary: deferred Musculoskeletal:  All extremities warm and well perfused. Full range of motion of upper extremities and right lower extremity without pain. Unable to flex left leg secondary to the pain. Pain with axial load. Tenderness to palpation along the medial aspect of the proximal femur on the left. Neurologic:  Normal speech and language. No gross focal neurologic deficits are appreciated.  Skin:  Skin is warm, dry. Small superficial skin tear right elbow Psychiatric: Mood and affect are normal. Speech and behavior are normal.  ____________________________________________   LABS (all labs ordered are listed, but only abnormal results are displayed)  Labs Reviewed  BASIC METABOLIC PANEL - Abnormal; Notable for the following:  Result Value   Sodium 134 (*)    Chloride 97 (*)    Glucose, Bld 140 (*)    BUN 34 (*)    Creatinine, Ser 1.28 (*)    GFR calc non Af Amer 35 (*)    GFR calc Af Amer 41 (*)    All other components within normal limits  CBC - Abnormal; Notable for the following:    WBC 14.4 (*)    RDW 14.6 (*)    All other components within normal limits  URINALYSIS, COMPLETE (UACMP) WITH MICROSCOPIC - Abnormal; Notable for the following:    Color, Urine YELLOW (*)    APPearance CLEAR (*)    Protein, ur 30 (*)    All other  components within normal limits  PROTIME-INR - Abnormal; Notable for the following:    Prothrombin Time 23.9 (*)    All other components within normal limits  CBG MONITORING, ED  TYPE AND SCREEN   ____________________________________________  EKG  None ____________________________________________  RADIOLOGY  Left pubic rami fractures ____________________________________________   PROCEDURES  Procedure(s) performed: No    Critical Care performed: No ____________________________________________   INITIAL IMPRESSION / ASSESSMENT AND PLAN / ED COURSE  Pertinent labs & imaging results that were available during my care of the patient were reviewed by me and considered in my medical decision making (see chart for details).  Patient presents after a fall. I'm concerned about hip fracture.  Clinical Course   X-ray confirms left pubic rami fracture, no hip fracture. Patient treated with IV morphine 2. She will require admission as she is unable to bear weight ____________________________________________   FINAL CLINICAL IMPRESSION(S) / ED DIAGNOSES  Final diagnoses:  Closed fracture of multiple pubic rami, left, initial encounter (HCC)      NEW MEDICATIONS STARTED DURING THIS VISIT:  New Prescriptions   No medications on file     Note:  This document was prepared using Dragon voice recognition software and may include unintentional dictation errors.    Jene Everyobert Britten Parady, MD 07/29/16 [phone]

## 2016-07-29 NOTE — Progress Notes (Signed)
Pelvic fracture but afib RVR - admit Tele

## 2016-07-29 NOTE — Care Management Obs Status (Signed)
MEDICARE OBSERVATION STATUS NOTIFICATION   Patient Details  Name: Kendra Thomas MRN: [id number] Date of Birth: [birth year]   Medicare Observation Status Notification Given:  Yes    Collie Siadngela Vitaliy Eisenhour, RN 07/29/2016, 3:48 PM

## 2016-07-29 NOTE — ED Notes (Signed)
Patient SpO2 99% on 4L, turned down to 2L. Tolerating well. SpO2 98%. Will continue to ween.

## 2016-07-29 NOTE — Progress Notes (Signed)
ANTICOAGULATION CONSULT NOTE - Initial Consult  Pharmacy Consult for warfarin Indication: atrial fibrillation  Allergies  Allergen Reactions  . Penicillins Rash    Has patient had a PCN reaction causing immediate rash, facial/tongue/throat swelling, SOB or lightheadedness with hypotension: no Has patient had a PCN reaction causing severe rash involving mucus membranes or skin necrosis: yes Has patient had a PCN reaction that required hospitalization yes Has patient had a PCN reaction occurring within the last 10 years: no If all of the above answers are "NO", then may proceed with Cephalosporin use.    Patient Measurements: Height: 5' (152.4 cm) Weight: 130 lb (59 kg) IBW/kg (Calculated) : 45.5  Vital Signs: BP: 128/74 (12/26 1400) Pulse Rate: 122 (12/26 1400)  Labs:  Recent Labs  07/29/16 1126  HGB 12.2  HCT 36.9  PLT 183  LABPROT 23.9*  INR 2.10  CREATININE 1.28*    Estimated Creatinine Clearance: 23 mL/min (by C-G formula based on SCr of 1.28 mg/dL (H)).  Medical History: Past Medical History:  Diagnosis Date  . Atrial fibrillation [facility](HCC)    Assessment: Pharmacy consulted to dose and monitor warfarin in this 82 year old female. She was taking warfarin prior to admission for atrial fibrillation and reports alternating between warfarin 1 mg and 2 mg every other day.  Last dose of warfarin PTA = 1 mg on 12/25 INR = 2.10 is therapeutic on admission  Goal of Therapy:  INR 2-3 Monitor platelets by anticoagulation protocol: Yes   Plan:  Continue with home regimen and order warfarin 2 mg PO @ 1800 this evening. INR ordered with AM labs tomorrow. Pharmacy to monitor CBC at least every 72 hours per protocol.  Cindi CarbonMary M Dorsel Flinn, PharmD Clinical Pharmacist 07/29/2016,2:35 PM

## 2016-07-29 NOTE — ED Notes (Signed)
Pt's O2 saturation dropped to 78% on RA, 4L of oxygen was applied, pt's O2 saturation is currently 97%. Pt A&O at this time.

## 2016-07-29 NOTE — Progress Notes (Signed)
Anticoagulation monitoring(Lovenox):  82yo  female ordered Lovenox 40 mg Q24h for DVt prevention  Filed Weights   07/29/16 1117  Weight: 130 lb (59 kg)   BMI  < 40   Lab Results  Component Value Date   CREATININE 1.28 (H) 07/29/2016   CREATININE 0.89 08/26/2011   Estimated Creatinine Clearance: 23 mL/min (by C-G formula based on SCr of 1.28 mg/dL (H)). Hemoglobin & Hematocrit     Component Value Date/Time   HGB 12.2 07/29/2016 1126   HGB 12.7 08/26/2011 1102   HCT 36.9 07/29/2016 1126   HCT 38.5 08/26/2011 1102     Per Protocol for Patient with estCrcl < 30 ml/min and BMI < 40, will transition to Lovenox 30 mg Q24h.     [name], PharmD, BCPS 07/29/2016 1:26 PM

## 2016-07-29 NOTE — ED Triage Notes (Signed)
Per [facility], patient comes from home after a fall. Patient called her niece [phone] this morning. Niece went to the house and assisted patient on to the cough. Patient c/o right sided back pain and left groin pain. Niece reports hearing a popping sound coming from hip. Patient screams out in pain when moving her. Patient neurologically at baseline.

## 2016-07-29 NOTE — H&P (Signed)
[facility] [place] [facility]   PATIENT NAME: Kendra Thomas    MR#:  [id number]  DATE OF BIRTH:  May 29, 1923   DATE OF ADMISSION:  07/29/2016  PRIMARY CARE PHYSICIAN: Bethann PunchesMark Miller   REQUESTING/REFERRING PHYSICIAN: Huel CoteQuigley  CHIEF COMPLAINT:   Chief Complaint  Patient presents with  . Fall    HISTORY OF PRESENT ILLNESS:  Kendra Thomas  is a 82 y.o. female with a known history of Chronic atrial fibrillation on warfarin for anticoagulation is presenting after mechanical fall. She states she used to use a cane at home and suffered a mechanical fall today noticing immediate pain in her left hip. Came to Hospital further workup and evaluation. She describes her pain as sharp and worse with movement to 6/10 intensity no relieving factors.  Noted be in atrial fibrillation rapid ventricular response in emergency department. States she did not take any of her home medications today.  PAST MEDICAL HISTORY:   Past Medical History:  Diagnosis Date  . Atrial fibrillation (HCC)     PAST SURGICAL HISTORY:   Past Surgical History:  Procedure Laterality Date  . BACK SURGERY      SOCIAL HISTORY:   Social History  Substance Use Topics  . Smoking status: Never Smoker  . Smokeless tobacco: Never Used  . Alcohol use No    FAMILY HISTORY:   Family History  Problem Relation Age of Onset  . Diabetes Neg Hx     DRUG ALLERGIES:   Allergies  Allergen Reactions  . Penicillins     REVIEW OF SYSTEMS:  REVIEW OF SYSTEMS:  CONSTITUTIONAL: Denies fevers, chills, fatigue, weakness.  EYES: Denies blurred vision, double vision, or eye pain.  EARS, NOSE, THROAT: Denies tinnitus, ear pain, hearing loss.  RESPIRATORY: denies cough, shortness of breath, wheezing  CARDIOVASCULAR: Denies chest pain,Positive palpitations, denies edema.  GASTROINTESTINAL: Denies nausea, vomiting, diarrhea, abdominal pain.  GENITOURINARY: Denies dysuria, hematuria.  ENDOCRINE: Denies  nocturia or thyroid problems. HEMATOLOGIC AND LYMPHATIC: Denies easy bruising or bleeding.  SKIN: Denies rash or lesions.  MUSCULOSKELETAL: Positive left hip pain otherwise Denies pain in neck, back, shoulder, knees, , or further arthritic symptoms.  NEUROLOGIC: Denies paralysis, paresthesias.  PSYCHIATRIC: Denies anxiety or depressive symptoms. Otherwise full review of systems performed by me is negative.   MEDICATIONS AT HOME:   Prior to Admission medications   Not on File      VITAL SIGNS:  Blood pressure (!) 119/94, pulse (!) 107, resp. rate (!) 21, height 5' (1.524 m), weight 59 kg (130 lb), SpO2 93 %.  PHYSICAL EXAMINATION:  VITAL SIGNS: Vitals:   07/29/16 1228 07/29/16 1330  BP: 106/86 (!) 119/94  Pulse: (!) 125 (!) 107  Resp: (!) 23 (!) 21   GENERAL:82 y.o.female currently in no acute distress.  HEAD: Normocephalic, atraumatic.  EYES: Pupils equal, round, reactive to light. Extraocular muscles intact. No scleral icterus.  MOUTH: Moist mucosal membrane. Dentition intact. No abscess noted.  EAR, NOSE, THROAT: Clear without exudates. No external lesions.  NECK: Supple. No thyromegaly. No nodules. No JVD.  PULMONARY: Clear to ascultation, without wheeze rails or rhonci. No use of accessory muscles, Good respiratory effort. good air entry bilaterally CHEST: Nontender to palpation.  CARDIOVASCULAR: S1 and S2. Irregular rate and rhythm. No murmurs, rubs, or gallops. No edema. Pedal pulses 2+ bilaterally.  GASTROINTESTINAL: Soft, nontender, nondistended. No masses. Positive bowel sounds. No hepatosplenomegaly.  MUSCULOSKELETAL: No swelling, clubbing, or edema. Range of motion Limited proximal left lower  extremity  NEUROLOGIC: Cranial nerves II through XII are intact. No gross focal neurological deficits. Sensation intact. Reflexes intact.  SKIN: No ulceration, lesions, rashes, or cyanosis. Skin warm and dry. Turgor intact.  PSYCHIATRIC: Mood, affect within normal limits. The  patient is awake, alert and oriented x 3. Insight, judgment intact.    LABORATORY PANEL:   CBC  Recent Labs Lab 07/29/16 1126  WBC 14.4*  HGB 12.2  HCT 36.9  PLT 183   ------------------------------------------------------------------------------------------------------------------  Chemistries   Recent Labs Lab 07/29/16 1126  NA 134*  K 4.3  CL 97*  CO2 27  GLUCOSE 140*  BUN 34*  CREATININE 1.28*  CALCIUM 9.4   ------------------------------------------------------------------------------------------------------------------  Cardiac Enzymes No results for input(s): TROPONINI in the last 168 hours. ------------------------------------------------------------------------------------------------------------------  RADIOLOGY:  Dg Hip Unilat With Pelvis 2-3 Views Left  Result Date: 07/29/2016 CLINICAL DATA:  Fall and left groin pain. EXAM: DG HIP (WITH OR WITHOUT PELVIS) 2-3V LEFT COMPARISON:  09/30/2010 FINDINGS: Displaced fracture of the left superior pubic ramus. Minimally displaced fracture involving the left inferior pubic ramus. The left hip is located. No evidence for a fracture involving the proximal left femur. No gross abnormality to the sacrum. Right hip appears to be grossly intact. IMPRESSION: Fractures of left pubic rami. Electronically Signed   By: Richarda OverlieAdam  Henn M.D.   On: 07/29/2016 12:12    EKG:   Orders placed or performed during the hospital encounter of 07/29/16  . ED EKG  . ED EKG    IMPRESSION AND PLAN:   82 year old Caucasian female history of chronic atrial fibrillation on warfarin presenting after mechanical fall  1. Pelvic fracture/intractable pain: Orthopedic consult, PT evaluation, pain medications, bowel regiment, check vitamin D level 2. Atrial fibrillation rapid ventricular response: Has not taken any of her home medications will dose IV Cardizem 1 continue oral medications, place on telemetry if does not improve we'll get cardiology  consult if requires a another dose of IV Cardizem will place on Cardizem drip, continue with warfarin 3. Essential hypertension: Lopressor 4. Vitamin D deficiency: Continue with supplementation    All the records are reviewed and case discussed with ED provider. Management plans discussed with the patient, family and they are in agreement.  CODE STATUS: Full  TOTAL TIME TAKING CARE OF THIS PATIENT: 33 minutes.    Hower,  Mardi MainlandDavid K M.D on 07/29/2016 at 1:45 PM  Between 7am to 6pm - Pager - [phone]  After 6pm: House Pager: - [phone]  [facility] [place] [facility]  Office  [phone]  CC: Primary care physician; No PCP Per Patient

## 2016-07-29 NOTE — Progress Notes (Signed)
Dr. Clint GuyHower made aware that pt did not receive Diltiazem 10mg  iv in ED, orders to give now.

## 2016-07-29 NOTE — Consult Note (Signed)
ORTHOPAEDIC CONSULTATION  PATIENT NAME: Kendra SickleJoyce Taylor Sundstrom DOB: Aug 21, 1922  MRN: [id number]  REQUESTING PHYSICIAN: Wyatt Hasteavid K Hower, MD  Chief Complaint: Left groin pain  HPI: Kendra Thomas is a 82 y.o. female who complains of  left groin plain. The patient tripped and sustained a mechanical fall early this morning landing on her left hip and side. She had the immediate onset of severe groin pain and had difficulty with moving the left lower extremity due to the groin pain. She denied any radiation of pain down the leg. She denied any numbness. Prior to the fall she was ambulatory with a cane.  Past Medical History:  Diagnosis Date  . Atrial fibrillation (HCC)   . GERD (gastroesophageal reflux disease)   . Hypertension   . Osteoporosis    Past Surgical History:  Procedure Laterality Date  . T10, T11 kyphoplasty    . T12,  L1 kyphoplasty     Social History   Social History  . Marital status: Married    Spouse name: N/A  . Number of children: N/A  . Years of education: N/A   Social History Main Topics  . Smoking status: Never Smoker  . Smokeless tobacco: Never Used  . Alcohol use No  . Drug use: Unknown  . Sexual activity: Not Asked   Other Topics Concern  . None   Social History Narrative  . None   Family History  Problem Relation Age of Onset  . Diabetes Neg Hx    Allergies  Allergen Reactions  . Penicillins Rash    Has patient had a PCN reaction causing immediate rash, facial/tongue/throat swelling, SOB or lightheadedness with hypotension: no Has patient had a PCN reaction causing severe rash involving mucus membranes or skin necrosis: yes Has patient had a PCN reaction that required hospitalization yes Has patient had a PCN reaction occurring within the last 10 years: no If all of the above answers are "NO", then may proceed with Cephalosporin use.    Prior to Admission medications   Medication Sig Start Date End Date Taking? Authorizing Provider   aspirin EC 81 MG tablet Take 81 mg by mouth daily.   Yes Historical Provider, MD  calcium carbonate (OSCAL) 1500 (600 Ca) MG TABS tablet Take 1,500 mg by mouth 2 (two) times daily with a meal.   Yes Historical Provider, MD  Cholecalciferol (VITAMIN D) 2000 units CAPS Take by mouth.   Yes Historical Provider, MD  fluticasone (FLONASE) 50 MCG/ACT nasal spray Place 2 sprays into both nostrils daily.   Yes Historical Provider, MD  furosemide (LASIX) 20 MG tablet Take 30 mg by mouth.   Yes Historical Provider, MD  hydrOXYzine (ATARAX/VISTARIL) 25 MG tablet Take 12.5-25 mg by mouth daily as needed.   Yes Historical Provider, MD  metoprolol succinate (TOPROL-XL) 25 MG 24 hr tablet Take 25 mg by mouth 2 (two) times daily.   Yes Historical Provider, MD  Multiple Vitamin (MULTIVITAMIN) tablet Take 1 tablet by mouth daily.   Yes Historical Provider, MD  warfarin (COUMADIN) 1 MG tablet Take 1-2 mg by mouth daily.   Yes Historical Provider, MD   Dg Hip Unilat With Pelvis 2-3 Views Left  Result Date: 07/29/2016 CLINICAL DATA:  Fall and left groin pain. EXAM: DG HIP (WITH OR WITHOUT PELVIS) 2-3V LEFT COMPARISON:  09/30/2010 FINDINGS: Displaced fracture of the left superior pubic ramus. Minimally displaced fracture involving the left inferior pubic ramus. The left hip is located. No evidence for a fracture involving the  proximal left femur. No gross abnormality to the sacrum. Right hip appears to be grossly intact. IMPRESSION: Fractures of left pubic rami. Electronically Signed   By: Richarda OverlieAdam  Henn M.D.   On: 07/29/2016 12:12    Positive ROS: All other systems have been reviewed and were otherwise negative with the exception of those mentioned in the HPI and as above.  Physical Exam: General: Alert and alert in no acute distress. HEENT: Atraumatic and normocephalic. Sclera are clear. Extraocular motion is intact. Oropharynx is clear with moist mucosa. Neck: Supple, nontender, good range of motion. Lungs: Clear  to auscultation bilaterally. Cardiovascular: Regular rate and rhythm with normal S1 and S2. No appreciable murmurs. Pedal pulses are palpable bilaterally. Homans test is negative bilaterally. No significant pretibial or ankle edema. Abdomen: Soft, nontender, and nondistended. Bowel sounds are present. Skin: No lesions in the area of chief complaint Neurologic: Awake, alert, and oriented. Sensory function is grossly intact. Motor strength is felt to be 5/5 with the exception of the left lower extremity which was not assessed due to the injury.. No clonus or tremor. Good motor coordination. Lymphatic: No axillary or cervical lymphadenopathy  MUSCULOSKELETAL: Examination of the left lower extremity shows no gross shortening or rotation. Groin pain is noted with attempts at logrolling or passive flexion of the hip. The patient is unable to perform heel slide or straight leg raise due to the groin pain.  Assessment: Left superior and inferior pubic rami fractures  Plan: The findings were discussed with the patient. She was reassured that the injury does not require any surgical intervention. I would recommend a physical therapy consult to initiate bed mobility and progress to heel slides and subsequently transfers and gait training. She will require the use of a walker initially. Pain appears to be well controlled at this time.  James P. Angie FavaHooten, Jr. M.D.

## 2016-07-30 ENCOUNTER — Observation Stay: Payer: Commercial Managed Care - HMO

## 2016-07-30 DIAGNOSIS — S32501A Unspecified fracture of right pubis, initial encounter for closed fracture: Secondary | ICD-10-CM | POA: Diagnosis not present

## 2016-07-30 DIAGNOSIS — S329XXA Fracture of unspecified parts of lumbosacral spine and pelvis, initial encounter for closed fracture: Secondary | ICD-10-CM | POA: Diagnosis not present

## 2016-07-30 DIAGNOSIS — I4891 Unspecified atrial fibrillation: Secondary | ICD-10-CM | POA: Diagnosis not present

## 2016-07-30 DIAGNOSIS — S299XXA Unspecified injury of thorax, initial encounter: Secondary | ICD-10-CM | POA: Diagnosis not present

## 2016-07-30 DIAGNOSIS — R52 Pain, unspecified: Secondary | ICD-10-CM | POA: Diagnosis not present

## 2016-07-30 DIAGNOSIS — I10 Essential (primary) hypertension: Secondary | ICD-10-CM | POA: Diagnosis not present

## 2016-07-30 LAB — VITAMIN D 25 HYDROXY (VIT D DEFICIENCY, FRACTURES): VIT D 25 HYDROXY: 50.5 ng/mL (ref 30.0–100.0)

## 2016-07-30 LAB — PROTIME-INR
INR: 2.49
Prothrombin Time: 27.4 seconds — ABNORMAL HIGH (ref 11.4–15.2)

## 2016-07-30 LAB — GLUCOSE, CAPILLARY: GLUCOSE-CAPILLARY: 125 mg/dL — AB (ref 65–99)

## 2016-07-30 MED ORDER — WARFARIN SODIUM 1 MG PO TABS
0.5000 mg | ORAL_TABLET | Freq: Once | ORAL | Status: AC
  Administered 2016-07-30: 0.5 mg via ORAL
  Filled 2016-07-30: qty 1

## 2016-07-30 NOTE — Clinical Social Work Note (Signed)
CSW acknowledge consult for patient needing SNF placement, CSW to meet with patient and complete assessment at a later time.  Kendra Thomas, MSW, Theresia MajorsLCSWA [phone]  Mon-Fri 8a-4:30p 07/30/2016 5:55 PM

## 2016-07-30 NOTE — Progress Notes (Signed)
[facility] [place] [facility]   PATIENT NAME: Kendra SimmerJoyce Copes    MRN#:  [id number]  DATE OF BIRTH:  06-08-1923  SUBJECTIVE:  Hospital Day: 0 days Kendra Thomas is a 82 y.o. female presenting with Fall .   Overnight events: No acute overnight events Interval Events: Worked with physical therapy this morning some pain but tolerable  REVIEW OF SYSTEMS:  CONSTITUTIONAL: No fever, fatigue or weakness.  EYES: No blurred or double vision.  EARS, NOSE, AND THROAT: No tinnitus or ear pain.  RESPIRATORY: No cough, shortness of breath, wheezing or hemoptysis.  CARDIOVASCULAR: No chest pain, orthopnea, edema.  GASTROINTESTINAL: No nausea, vomiting, diarrhea or abdominal pain.  GENITOURINARY: No dysuria, hematuria.  ENDOCRINE: No polyuria, nocturia,  HEMATOLOGY: No anemia, easy bruising or bleeding SKIN: No rash or lesion. MUSCULOSKELETAL: Left-sided pelvic pain with movement otherwise No joint pain or arthritis.   NEUROLOGIC: No tingling, numbness, weakness.  PSYCHIATRY: No anxiety or depression.   DRUG ALLERGIES:   Allergies  Allergen Reactions  . Penicillins Rash    Has patient had a PCN reaction causing immediate rash, facial/tongue/throat swelling, SOB or lightheadedness with hypotension: no Has patient had a PCN reaction causing severe rash involving mucus membranes or skin necrosis: yes Has patient had a PCN reaction that required hospitalization yes Has patient had a PCN reaction occurring within the last 10 years: no If all of the above answers are "NO", then may proceed with Cephalosporin use.     VITALS:  Blood pressure (!) 100/59, pulse (!) 103, temperature 98.5 F (36.9 C), temperature source Oral, resp. rate 19, height 5' (1.524 m), weight 59 kg (130 lb), SpO2 92 %.  PHYSICAL EXAMINATION:  VITAL SIGNS: Vitals:   07/30/16 1205 07/30/16 1233  BP: (!) 100/59   Pulse: (!) 109 (!) 103  Resp: 19   Temp: 98.5 F (36.9 C)    GENERAL:82  y.o.female currently in no acute distress.  HEAD: Normocephalic, atraumatic.  EYES: Pupils equal, round, reactive to light. Extraocular muscles intact. No scleral icterus.  MOUTH: Moist mucosal membrane. Dentition intact. No abscess noted.  EAR, NOSE, THROAT: Clear without exudates. No external lesions.  NECK: Supple. No thyromegaly. No nodules. No JVD.  PULMONARY: Clear to ascultation, without wheeze rails or rhonci. No use of accessory muscles, Good respiratory effort. good air entry bilaterally CHEST: Nontender to palpation.  CARDIOVASCULAR: S1 and S2. Irregular rate and rhythm. No murmurs, rubs, or gallops. No edema. Pedal pulses 2+ bilaterally.  GASTROINTESTINAL: Soft, nontender, nondistended. No masses. Positive bowel sounds. No hepatosplenomegaly.  MUSCULOSKELETAL: No swelling, clubbing, or edema. Range of motion full in all extremities.  NEUROLOGIC: Cranial nerves II through XII are intact. No gross focal neurological deficits. Sensation intact. Reflexes intact.  SKIN: No ulceration, lesions, rashes, or cyanosis. Skin warm and dry. Turgor intact.  PSYCHIATRIC: Mood, affect within normal limits. The patient is awake, alert and oriented x 3. Insight, judgment intact.      LABORATORY PANEL:   CBC  Recent Labs Lab 07/29/16 1126  WBC 14.4*  HGB 12.2  HCT 36.9  PLT 183   ------------------------------------------------------------------------------------------------------------------  Chemistries   Recent Labs Lab 07/29/16 1126  NA 134*  K 4.3  CL 97*  CO2 27  GLUCOSE 140*  BUN 34*  CREATININE 1.28*  CALCIUM 9.4   ------------------------------------------------------------------------------------------------------------------  Cardiac Enzymes No results for input(s): TROPONINI in the last 168 hours. ------------------------------------------------------------------------------------------------------------------  RADIOLOGY:  Dg Chest Port 1 View  Result Date:  07/30/2016 CLINICAL  DATA:  Atrial fibrillation.  Fall. EXAM: PORTABLE CHEST 1 VIEW COMPARISON:  June 25, 2011 FINDINGS: The study is limited due to the positioning. No pneumothorax. Cardiomegaly identified. No pneumothorax. Multilevel vertebroplasties. No other acute abnormalities. IMPRESSION: Limited study with no acute abnormality identified. Electronically Signed   By: Gerome Samavid  Williams III M.D   On: 07/30/2016 12:18   Dg Hip Unilat With Pelvis 2-3 Views Left  Result Date: 07/29/2016 CLINICAL DATA:  Fall and left groin pain. EXAM: DG HIP (WITH OR WITHOUT PELVIS) 2-3V LEFT COMPARISON:  09/30/2010 FINDINGS: Displaced fracture of the left superior pubic ramus. Minimally displaced fracture involving the left inferior pubic ramus. The left hip is located. No evidence for a fracture involving the proximal left femur. No gross abnormality to the sacrum. Right hip appears to be grossly intact. IMPRESSION: Fractures of left pubic rami. Electronically Signed   By: Richarda OverlieAdam  Henn M.D.   On: 07/29/2016 12:12   Dg Hip Unilat With Pelvis 2-3 Views Right  Result Date: 07/30/2016 CLINICAL DATA:  Right hip pain. EXAM: DG HIP (WITH OR WITHOUT PELVIS) 2-3V RIGHT COMPARISON:  None. FINDINGS: There is a fracture of the left pubis adjacent to the medial left acetabulum. There is a probable additional fracture of the inferior left pubic ramus. No other evidence of a fracture.  No bone lesions. Hip joints, SI joints and symphysis pubis are normally aligned. Bones are diffusely demineralized. IMPRESSION: 1. Nondisplaced fracture of the left pubic bone adjacent to the left acetabulum with a probable nondisplaced fracture of the left inferior pubic ramus. 2. No other fractures. Specifically, no right proximal femur/hip fracture. No dislocation. Electronically Signed   By: Amie Portlandavid  Ormond M.D.   On: 07/30/2016 12:57    EKG:   Orders placed or performed during the hospital encounter of 07/29/16  . ED EKG  . ED EKG     ASSESSMENT AND PLAN:   Kendra Thomas is a 82 y.o. female presenting with Fall . Admitted 07/29/2016 : Day #: 0 days 1. Atrial fibrillation rapid ventricular response: Better controlled 2. Pelvic fracture: Appreciate orthopedic surgery input pain control physical therapy 3. Hypoxia: Check chest x-ray 4. Essential hypertension: Lopressor  PT eval  All the records are reviewed and case discussed with Care Management/Social Workerr. Management plans discussed with the patient, family and they are in agreement.  CODE STATUS: full TOTAL TIME TAKING CARE OF THIS PATIENT: 28 minutes.   POSSIBLE D/C IN 1DAYS, DEPENDING ON CLINICAL CONDITION.   Amr Sturtevant,  Mardi MainlandDavid K M.D on 07/30/2016 at 2:26 PM  Between 7am to 6pm - Pager - [phone]  After 6pm: House Pager: - [phone]  [facility]  Office  [phone]  CC: Primary care physician; No PCP Per Patient

## 2016-07-30 NOTE — Progress Notes (Signed)
ANTICOAGULATION CONSULT NOTE - Initial Consult  Pharmacy Consult for warfarin Indication: atrial fibrillation  Allergies  Allergen Reactions  . Penicillins Rash    Has patient had a PCN reaction causing immediate rash, facial/tongue/throat swelling, SOB or lightheadedness with hypotension: no Has patient had a PCN reaction causing severe rash involving mucus membranes or skin necrosis: yes Has patient had a PCN reaction that required hospitalization yes Has patient had a PCN reaction occurring within the last 10 years: no If all of the above answers are "NO", then may proceed with Cephalosporin use.    Patient Measurements: Height: 5' (152.4 cm) Weight: 130 lb (59 kg) IBW/kg (Calculated) : 45.5  Vital Signs: Temp: 97.8 F (36.6 C) (12/27 0853) Temp Source: Oral (12/27 0853) BP: 99/52 (12/27 0853) Pulse Rate: 98 (12/27 0853)  Labs:  Recent Labs  07/29/16 1126 07/30/16 0456  HGB 12.2  --   HCT 36.9  --   PLT 183  --   LABPROT 23.9* 27.4*  INR 2.10 2.49  CREATININE 1.28*  --     Estimated Creatinine Clearance: 23 mL/min (by C-G formula based on SCr of 1.28 mg/dL (H)).  Medical History: Past Medical History:  Diagnosis Date  . Atrial fibrillation (HCC)   . GERD (gastroesophageal reflux disease)   . Hypertension   . Osteoporosis    Assessment: Pharmacy consulted to dose and monitor warfarin in this 82 year old female. She was taking warfarin prior to admission for atrial fibrillation and reports alternating between warfarin 1 mg and 2 mg every other day.  Last dose of warfarin PTA = 1 mg on 12/25 INR = 2.10 is therapeutic on admission  Dosing History: Date INR Dose  12/26 2.10 2 mg 12/27 2.49  Goal of Therapy:  INR 2-3 Monitor platelets by anticoagulation protocol: Yes   Plan:  INR = 2.49 remains therapeutic but with increase from yesterday. Will give a slightly reduced dose of warfarin 0.5 mg PO once @ 1800 this evening.  INR ordered with AM labs  tomorrow. Pharmacy to monitor CBC at least every 72 hours per protocol.  Cindi CarbonMary M Geffrey Michaelsen, PharmD, BCPS Clinical Pharmacist 07/30/2016,10:25 AM

## 2016-07-30 NOTE — Evaluation (Signed)
Physical Therapy Evaluation Patient Details Name: Kendra Thomas MRN: [id number] DOB: 06-03-1923 Today's Date: 07/30/2016   History of Present Illness  Pt is a 82 y.o. female who complains of  left groin plain. The patient tripped and sustained a mechanical fall early this morning landing on her left hip and side. She had the immediate onset of severe groin pain and had difficulty with moving the left lower extremity due to the groin pain. She denied any radiation of pain down the leg. She denied any numbness. Prior to the fall she was ambulatory with a cane.  Pt is s/p left superior and inferior pubic rami fractures.    Clinical Impression  Pt presents with deficits in strength, transfers, mobility, gait, balance, and activity tolerance. Pt required +2 Max A with bed mobility tasks and max encouragement to participate during session.  At beginning of session pt cried out with even light touch to LLE but with encouragement and by slowly progressing amplitude pt eventually was able to tolerate various supine BLE exercises per below including heel slides.  Transfers/gait deferred this session per Dr. Elenor LegatoHooten's guidelines below but based on how pt progressed during therex this date transfer attempt may be appropriate soon.  Pt will benefit from PT services to address above deficits for decreased caregiver assistance upon discharge.          Follow Up Recommendations SNF    Equipment Recommendations  Rolling walker with 5" wheels (Please confirm if pt already has a RW at home)    Recommendations for Other Services       Precautions / Restrictions Precautions Precautions: Fall Restrictions Weight Bearing Restrictions: Yes LLE Weight Bearing: Weight bearing as tolerated Other Position/Activity Restrictions: Per Dr. Ernest PineHooten, work on bed mobility and heel slides and then progress to weight bearing activities as pt can tolerate      Mobility  Bed Mobility Overal bed mobility: Needs  Assistance Bed Mobility: Supine to Sit;Sit to Supine     Supine to sit: +2 for physical assistance;Max assist Sit to supine: +2 for physical assistance;Max assist   General bed mobility comments: Pt requires max encouragement to participate in bed mobility tasks and is near total assist  Transfers                 General transfer comment: Deferred this date; Per Dr. Ernest PineHooten, focus on bed mobility and heel slides and progress to WB activities at pt can tolerate.   Ambulation/Gait             General Gait Details: Deferred this date; Per Dr. Ernest PineHooten, focus on bed mobility and heel slides and progress to WB activities at pt can tolerate.   Stairs            Wheelchair Mobility    Modified Rankin (Stroke Patients Only)       Balance Overall balance assessment: Needs assistance Sitting-balance support: Bilateral upper extremity supported Sitting balance-Leahy Scale: Fair Sitting balance - Comments: Sitting balance initially poor but progressed to fair                                     Pertinent Vitals/Pain Pain Assessment: No/denies pain (Pt denies pain at rest but cries out with any slight movement of LLE)    Home Living Family/patient expects to be discharged to:: Private residence Living Arrangements: Alone Available Help at Discharge: Available PRN/intermittently Type of Home:  House       Home Layout: One level;Laundry or work area in [employer]ney [employer]basement Home Equipment: Gilmer MorCane - single point      Prior Function Level of Independence: Independent with assistive device(s)         Comments: Ind amb with SPC and Ind with ADLs, 2 falls in last year     Hand Dominance   Dominant Hand: Right    Extremity/Trunk Assessment        Lower Extremity Assessment Lower Extremity Assessment: Generalized weakness;LLE deficits/detail LLE: Unable to fully assess due to pain LLE Sensation:  (Light touch and proprioception grossly intact to BLEs)        Communication   Communication: No difficulties  Cognition Arousal/Alertness: Awake/alert Behavior During Therapy: WFL for tasks assessed/performed Overall Cognitive Status: Within Functional Limits for tasks assessed                      General Comments      Exercises Total Joint Exercises Ankle Circles/Pumps: Strengthening;Both;10 reps;15 reps Quad Sets: AROM;Left;10 reps;15 reps Gluteal Sets: AROM;Both;10 reps Short Arc Quad: Strengthening;Both;10 reps Heel Slides: AAROM;Both;10 reps;15 reps Hip ABduction/ADduction: AAROM;Left;10 reps Straight Leg Raises: AAROM;Both;10 reps Other Exercises Other Exercises: HEP education with pt and family for BLE APs, QS, and GS x 10 5-6x/day as tolerated   Assessment/Plan    PT Assessment Patient needs continued PT services  PT Problem List Decreased strength;Decreased activity tolerance;Decreased balance;Decreased knowledge of use of DME;Decreased mobility          PT Treatment Interventions DME instruction;Gait training;Functional mobility training;Stair training;Therapeutic activities;Therapeutic exercise;Balance training;Neuromuscular re-education;Patient/family education    PT Goals (Current goals can be found in the Care Plan section)  Acute Rehab PT Goals Patient Stated Goal: To walk a bit better PT Goal Formulation: With patient Time For Goal Achievement: 08/12/16 Potential to Achieve Goals: Fair    Frequency 7X/week   Barriers to discharge        Co-evaluation               End of Session Equipment Utilized During Treatment: Oxygen Activity Tolerance: Patient limited by pain Patient left: in bed;with bed alarm set;with SCD's reapplied;with call bell/phone within reach;with family/visitor present      Functional Assessment Tool Used: Clinical reasoning Functional Limitation: Mobility: Walking and moving around Mobility: Walking and Moving Around Current Status ([id number]): At least 80 percent but  less than 100 percent impaired, limited or restricted Mobility: Walking and Moving Around Goal Status [phone]): At least 20 percent but less than 40 percent impaired, limited or restricted    Time: 1007-1048 PT Time Calculation (min) (ACUTE ONLY): 41 min   Charges:   PT Evaluation $PT Eval Low Complexity: 1 Procedure PT Treatments $Therapeutic Exercise: 8-22 mins   PT G Codes:   PT G-Codes **NOT FOR INPATIENT CLASS** Functional Assessment Tool Used: Clinical reasoning Functional Limitation: Mobility: Walking and moving around Mobility: Walking and Moving Around Current Status ([id number]): At least 80 percent but less than 100 percent impaired, limited or restricted Mobility: Walking and Moving Around Goal Status [phone]([phone]): At least 20 percent but less than 40 percent impaired, limited or restricted    D. Scott Jerusha Reising PT, DPT 07/30/16, 1:25 PM

## 2016-07-30 NOTE — Progress Notes (Signed)
Patient sat drop 89% on 2 lits , DR Hower made aware , order chest x-ray , 02 increase to 3 lit at this time , will continue to monitor

## 2016-07-31 ENCOUNTER — Encounter
Admission: RE | Admit: 2016-07-31 | Discharge: 2016-07-31 | Disposition: A | Discharge status: Home / Self Care | Payer: Commercial Managed Care - HMO | Source: Ambulatory Visit | Attending: Internal Medicine | Admitting: Internal Medicine

## 2016-07-31 DIAGNOSIS — I5042 Chronic combined systolic (congestive) and diastolic (congestive) heart failure: Secondary | ICD-10-CM | POA: Diagnosis not present

## 2016-07-31 DIAGNOSIS — I10 Essential (primary) hypertension: Secondary | ICD-10-CM | POA: Diagnosis not present

## 2016-07-31 DIAGNOSIS — Z7982 Long term (current) use of aspirin: Secondary | ICD-10-CM | POA: Diagnosis not present

## 2016-07-31 DIAGNOSIS — I5032 Chronic diastolic (congestive) heart failure: Secondary | ICD-10-CM | POA: Diagnosis not present

## 2016-07-31 DIAGNOSIS — M25559 Pain in unspecified hip: Secondary | ICD-10-CM | POA: Diagnosis not present

## 2016-07-31 DIAGNOSIS — M6281 Muscle weakness (generalized): Secondary | ICD-10-CM | POA: Diagnosis not present

## 2016-07-31 DIAGNOSIS — E559 Vitamin D deficiency, unspecified: Secondary | ICD-10-CM | POA: Diagnosis not present

## 2016-07-31 DIAGNOSIS — R1900 Intra-abdominal and pelvic swelling, mass and lump, unspecified site: Secondary | ICD-10-CM | POA: Diagnosis not present

## 2016-07-31 DIAGNOSIS — Z7901 Long term (current) use of anticoagulants: Secondary | ICD-10-CM | POA: Diagnosis not present

## 2016-07-31 DIAGNOSIS — R0602 Shortness of breath: Secondary | ICD-10-CM | POA: Diagnosis not present

## 2016-07-31 DIAGNOSIS — Z7951 Long term (current) use of inhaled steroids: Secondary | ICD-10-CM | POA: Diagnosis not present

## 2016-07-31 DIAGNOSIS — M810 Age-related osteoporosis without current pathological fracture: Secondary | ICD-10-CM | POA: Diagnosis not present

## 2016-07-31 DIAGNOSIS — R0682 Tachypnea, not elsewhere classified: Secondary | ICD-10-CM | POA: Diagnosis not present

## 2016-07-31 DIAGNOSIS — S329XXA Fracture of unspecified parts of lumbosacral spine and pelvis, initial encounter for closed fracture: Secondary | ICD-10-CM | POA: Diagnosis not present

## 2016-07-31 DIAGNOSIS — E871 Hypo-osmolality and hyponatremia: Secondary | ICD-10-CM | POA: Diagnosis not present

## 2016-07-31 DIAGNOSIS — S32592A Other specified fracture of left pubis, initial encounter for closed fracture: Secondary | ICD-10-CM | POA: Diagnosis not present

## 2016-07-31 DIAGNOSIS — J810 Acute pulmonary edema: Secondary | ICD-10-CM | POA: Diagnosis not present

## 2016-07-31 DIAGNOSIS — J449 Chronic obstructive pulmonary disease, unspecified: Secondary | ICD-10-CM | POA: Diagnosis not present

## 2016-07-31 DIAGNOSIS — J069 Acute upper respiratory infection, unspecified: Secondary | ICD-10-CM | POA: Diagnosis not present

## 2016-07-31 DIAGNOSIS — I4891 Unspecified atrial fibrillation: Secondary | ICD-10-CM | POA: Diagnosis not present

## 2016-07-31 DIAGNOSIS — S32512A Fracture of superior rim of left pubis, initial encounter for closed fracture: Secondary | ICD-10-CM | POA: Diagnosis not present

## 2016-07-31 DIAGNOSIS — J9611 Chronic respiratory failure with hypoxia: Secondary | ICD-10-CM | POA: Diagnosis not present

## 2016-07-31 DIAGNOSIS — M80052D Age-related osteoporosis with current pathological fracture, left femur, subsequent encounter for fracture with routine healing: Secondary | ICD-10-CM | POA: Diagnosis not present

## 2016-07-31 DIAGNOSIS — I110 Hypertensive heart disease with heart failure: Secondary | ICD-10-CM | POA: Diagnosis not present

## 2016-07-31 DIAGNOSIS — I482 Chronic atrial fibrillation: Secondary | ICD-10-CM | POA: Diagnosis not present

## 2016-07-31 DIAGNOSIS — Z9981 Dependence on supplemental oxygen: Secondary | ICD-10-CM | POA: Diagnosis not present

## 2016-07-31 DIAGNOSIS — R0989 Other specified symptoms and signs involving the circulatory and respiratory systems: Secondary | ICD-10-CM | POA: Diagnosis not present

## 2016-07-31 DIAGNOSIS — Z7401 Bed confinement status: Secondary | ICD-10-CM | POA: Diagnosis not present

## 2016-07-31 DIAGNOSIS — R262 Difficulty in walking, not elsewhere classified: Secondary | ICD-10-CM | POA: Diagnosis not present

## 2016-07-31 DIAGNOSIS — Z9181 History of falling: Secondary | ICD-10-CM | POA: Diagnosis not present

## 2016-07-31 DIAGNOSIS — K5900 Constipation, unspecified: Secondary | ICD-10-CM | POA: Diagnosis not present

## 2016-07-31 DIAGNOSIS — Z79899 Other long term (current) drug therapy: Secondary | ICD-10-CM | POA: Diagnosis not present

## 2016-07-31 DIAGNOSIS — R062 Wheezing: Secondary | ICD-10-CM | POA: Diagnosis not present

## 2016-07-31 DIAGNOSIS — R52 Pain, unspecified: Secondary | ICD-10-CM | POA: Diagnosis not present

## 2016-07-31 DIAGNOSIS — R109 Unspecified abdominal pain: Secondary | ICD-10-CM | POA: Diagnosis not present

## 2016-07-31 LAB — PROTIME-INR
INR: 3.23
Prothrombin Time: 33.7 seconds — ABNORMAL HIGH (ref 11.4–15.2)

## 2016-07-31 LAB — GLUCOSE, CAPILLARY: Glucose-Capillary: 112 mg/dL — ABNORMAL HIGH (ref 65–99)

## 2016-07-31 MED ORDER — FLEET ENEMA 7-19 GM/118ML RE ENEM: 1.0000 | ENEMA | Freq: Every day | RECTAL | Status: DC | PRN

## 2016-07-31 MED ORDER — FUROSEMIDE 10 MG/ML IJ SOLN: 20.0000 mg | Freq: Once | INTRAMUSCULAR | Status: DC

## 2016-07-31 MED ORDER — FUROSEMIDE 10 MG/ML IJ SOLN: 40.0000 mg | Freq: Once | INTRAMUSCULAR | Status: DC

## 2016-07-31 MED ORDER — ALPRAZOLAM 0.25 MG PO TABS: 0.2500 mg | ORAL_TABLET | Freq: Three times a day (TID) | ORAL | 0 refills | Status: AC | PRN

## 2016-07-31 MED ORDER — MORPHINE SULFATE (PF) 4 MG/ML IV SOLN: 1.0000 mg | INTRAVENOUS | Status: DC | PRN

## 2016-07-31 MED ORDER — METOPROLOL TARTRATE 25 MG PO TABS
25.0000 mg | ORAL_TABLET | Freq: Two times a day (BID) | ORAL | 0 refills | Status: DC
Start: 2016-07-31 — End: 2016-07-31

## 2016-07-31 MED ORDER — SENNA 8.6 MG PO TABS
2.0000 | ORAL_TABLET | Freq: Every day | ORAL | Status: DC
  Administered 2016-07-31: 17.2 mg via ORAL
  Filled 2016-07-31: qty 2

## 2016-07-31 MED ORDER — OXYCODONE HCL 5 MG PO TABS: 5.0000 mg | ORAL_TABLET | ORAL | 0 refills | Status: AC | PRN

## 2016-07-31 MED ORDER — DOCUSATE SODIUM 100 MG PO CAPS: 100.0000 mg | ORAL_CAPSULE | Freq: Two times a day (BID) | ORAL | 0 refills | Status: AC

## 2016-07-31 MED ORDER — BISACODYL 5 MG PO TBEC: 5.0000 mg | DELAYED_RELEASE_TABLET | Freq: Every day | ORAL | 0 refills | Status: AC | PRN

## 2016-07-31 MED ORDER — LACTULOSE 10 GM/15ML PO SOLN
20.0000 g | Freq: Three times a day (TID) | ORAL | Status: DC
  Administered 2016-07-31 (×3): 20 g via ORAL
  Filled 2016-07-31 (×3): qty 30

## 2016-07-31 MED ORDER — POLYETHYLENE GLYCOL 3350 17 G PO PACK: 17.0000 g | PACK | Freq: Every day | ORAL | 0 refills | Status: AC | PRN

## 2016-07-31 MED ORDER — DILTIAZEM HCL 30 MG PO TABS: 30.0000 mg | ORAL_TABLET | Freq: Three times a day (TID) | ORAL | 0 refills | Status: AC

## 2016-07-31 MED ORDER — FUROSEMIDE 10 MG/ML IJ SOLN
20.0000 mg | Freq: Once | INTRAMUSCULAR | Status: AC
  Administered 2016-07-31: 20 mg via INTRAVENOUS
  Filled 2016-07-31: qty 2

## 2016-07-31 MED ORDER — IPRATROPIUM-ALBUTEROL 0.5-2.5 (3) MG/3ML IN SOLN: 3.0000 mL | RESPIRATORY_TRACT | 0 refills | Status: AC | PRN

## 2016-07-31 MED ORDER — BISACODYL 5 MG PO TBEC: 5.0000 mg | DELAYED_RELEASE_TABLET | Freq: Every day | ORAL | Status: DC | PRN

## 2016-07-31 MED ORDER — ALPRAZOLAM 0.25 MG PO TABS
0.2500 mg | ORAL_TABLET | Freq: Once | ORAL | Status: AC
  Administered 2016-07-31: 0.25 mg via ORAL
  Filled 2016-07-31: qty 1

## 2016-07-31 MED ORDER — ACETAMINOPHEN 325 MG PO TABS: 650.0000 mg | ORAL_TABLET | Freq: Four times a day (QID) | ORAL | 0 refills | Status: AC | PRN

## 2016-07-31 NOTE — Consult Note (Signed)
ORTHOPAEDICS: Diagnosis: Left superior and inferior pubic rami fractures  [name]. She is complaining of a stinging pain to the right antecubital fossa where an IV was just placed. She does report some soreness to the left hip and groin. Notes from physical therapy were reviewed. She demonstrated limited bed mobility. I have instructed the therapist to work on heel slides and bed mobility with progression to transfers and gait training as per tolerance.  I would agree with recommendations for rehabilitation in a skilled nursing facility.  Kendra Thomas P. Angie FavaHooten, Jr. M.D.

## 2016-07-31 NOTE — Progress Notes (Signed)
ANTICOAGULATION CONSULT NOTE - Initial Consult  Pharmacy Consult for warfarin Indication: atrial fibrillation  Allergies  Allergen Reactions  . Penicillins Rash    Has patient had a PCN reaction causing immediate rash, facial/tongue/throat swelling, SOB or lightheadedness with hypotension: no Has patient had a PCN reaction causing severe rash involving mucus membranes or skin necrosis: yes Has patient had a PCN reaction that required hospitalization yes Has patient had a PCN reaction occurring within the last 10 years: no If all of the above answers are "NO", then may proceed with Cephalosporin use.    Patient Measurements: Height: 5' (152.4 cm) Weight: 130 lb (59 kg) IBW/kg (Calculated) : 45.5  Vital Signs: Temp: 98 F (36.7 C) (12/28 0844) Temp Source: Oral (12/28 0844) BP: 106/51 (12/28 0844) Pulse Rate: 85 (12/28 0844)  Labs:  Recent Labs  07/29/16 1126 07/30/16 0456 07/31/16 0430  HGB 12.2  --   --   HCT 36.9  --   --   PLT 183  --   --   LABPROT 23.9* 27.4* 33.7*  INR 2.10 2.49 3.23  CREATININE 1.28*  --   --     Estimated Creatinine Clearance: 23 mL/min (by C-G formula based on SCr of 1.28 mg/dL (H)).  Medical History: Past Medical History:  Diagnosis Date  . Atrial fibrillation (HCC)   . GERD (gastroesophageal reflux disease)   . Hypertension   . Osteoporosis    Assessment: Pharmacy consulted to dose and monitor warfarin in this 82 year old female. She was taking warfarin prior to admission for atrial fibrillation and reports alternating between warfarin 1 mg and 2 mg every other day.  Last dose of warfarin PTA = 1 mg on 12/25 INR = 2.10 was therapeutic on admission  Dosing History: Date INR Dose  12/26 2.10 2 mg 12/27 2.49 0.5 mg 12/28 3.23 HOLD   Goal of Therapy:  INR 2-3 Monitor platelets by anticoagulation protocol: Yes   Plan:  INR = 3.23 is supratherapeutic today. Will HOLD warfarin tonight - anticipate that INR may increase again  tomorrow. Unsure as to why INR is increasing - INR was therapeutic on admission and she has been continued on PTA dose. No concurrent medications prescribed that would have significant drug interactions with warfarin.  Anticipate she will need to be discharged on a lower dose.  INR ordered with AM labs tomorrow. Pharmacy to monitor CBC at least every 72 hours per protocol.  Cindi CarbonMary M Errick Salts, PharmD, BCPS Clinical Pharmacist 07/31/2016,9:57 AM

## 2016-07-31 NOTE — [place] FL2 (Signed)
[place] MEDICAID FL2 LEVEL OF CARE SCREENING TOOL     IDENTIFICATION  Patient Name: Kendra Thomas Birthdate: 03-17-1923 Sex: female Admission Date (Current Location): 07/29/2016  [place]   Facility and Address:  [facility], [address], [place], [place] [address]      Provider Number: [id number]  Attending Physician Name and Address:  Wyatt Hasteavid K Hower, MD  Relative Name and Phone Number:  Kendra Thomas,Kendra Thomas [phone] or Kendra Thomas,Kendra Thomas [phone]     Current Level of Care: Hospital Recommended Level of Care: Skilled Nursing Facility Prior Approval Number:    Date Approved/Denied:   PASRR Number:    Discharge Plan: SNF    Current Diagnoses: Patient Active Problem List   Diagnosis Date Noted  . Pelvic fracture (HCC) 07/29/2016    Orientation RESPIRATION BLADDER Height & Weight     Self, Place, Situation  O2 (3 L) Continent Weight: 130 lb (59 kg) Height:  5' (152.4 cm)  BEHAVIORAL SYMPTOMS/MOOD NEUROLOGICAL BOWEL NUTRITION STATUS      Continent Diet  AMBULATORY STATUS COMMUNICATION OF NEEDS Skin   Limited Assist Verbally Normal                       Personal Care Assistance Level of Assistance  Bathing, Feeding, Dressing Bathing Assistance: Limited assistance Feeding assistance: Independent Dressing Assistance: Limited assistance     Functional Limitations Info  Sight, Hearing, Speech Sight Info: Adequate Hearing Info: Adequate Speech Info: Adequate    SPECIAL CARE FACTORS FREQUENCY  PT (By licensed PT)     PT Frequency: 5x a week              Contractures Contractures Info: Not present    Additional Factors Info  Code Status, Allergies Code Status Info: Full Code Allergies Info: Penicillins           Current Medications (07/31/2016):  This is the current hospital active medication list Current Facility-Administered Medications  Medication Dose Route  Frequency Provider Last Rate Last Dose  . acetaminophen (TYLENOL) tablet 650 mg  650 mg Oral Q6H PRN Wyatt Hasteavid K Hower, MD   650 mg at 07/31/16 0035   Or  . acetaminophen (TYLENOL) suppository 650 mg  650 mg Rectal Q6H PRN Wyatt Hasteavid K Hower, MD      . bisacodyl (DULCOLAX) EC tablet 5 mg  5 mg Oral Daily PRN Wyatt Hasteavid K Hower, MD      . diltiazem (CARDIZEM) tablet 30 mg  30 mg Oral Q8H Wyatt Hasteavid K Hower, MD   30 mg at 07/30/16 2218  . docusate sodium (COLACE) capsule 100 mg  100 mg Oral BID Wyatt Hasteavid K Hower, MD   100 mg at 07/31/16 0945  . furosemide (LASIX) injection 20 mg  20 mg Intravenous Once Wyatt Hasteavid K Hower, MD      . lactulose (CHRONULAC) 10 GM/15ML solution 20 g  20 g Oral TID Arnaldo NatalMichael S Diamond, MD   20 g at 07/31/16 0945  . metoprolol tartrate (LOPRESSOR) tablet 25 mg  25 mg Oral BID Wyatt Hasteavid K Hower, MD   25 mg at 07/31/16 0944  . morphine 2 MG/ML injection 2 mg  2 mg Intravenous Once Jene Everyobert Kinner, MD      . morphine 4 MG/ML injection 1-2 mg  1-2 mg Intravenous Q4H PRN Arnaldo NatalMichael S Diamond, MD      . ondansetron [facility](ZOFRAN) tablet 4 mg  4 mg Oral Q6H PRN Wyatt Hasteavid K Hower, MD  Or  . ondansetron (ZOFRAN) injection 4 mg  4 mg Intravenous Q6H PRN Wyatt Hasteavid K Hower, MD      . oxyCODONE (Oxy IR/ROXICODONE) immediate release tablet 5 mg  5 mg Oral Q4H PRN Wyatt Hasteavid K Hower, MD   5 mg at 07/31/16 0003  . polyethylene glycol (MIRALAX / GLYCOLAX) packet 17 g  17 g Oral Daily PRN Wyatt Hasteavid K Hower, MD   17 g at 07/31/16 0034  . senna (SENOKOT) tablet 17.2 mg  2 tablet Oral Daily Wyatt Hasteavid K Hower, MD      . sodium phosphate (FLEET) 7-19 GM/118ML enema 1 enema  1 enema Rectal Daily PRN Wyatt Hasteavid K Hower, MD      . Warfarin - Pharmacist Dosing Inpatient   Does not apply q1800 Cindi CarbonMary M Swayne, [facility] CenterRPH         Discharge Medications: Please see discharge summary for a list of discharge medications.  Relevant Imaging Results:  Relevant Lab Results:   Additional Information SSN [id number]  Darleene Cleavernterhaus, Donavyn Fecher R, [place]

## 2016-07-31 NOTE — Progress Notes (Signed)
PATIENT IS DISCHARGE TO SNF IN A STABLE CONDITION, REPORT GIVEN TO FLOOR NURSE , AWAITING PICK UP BY EMS, FAMILY AT BEDSIDE

## 2016-07-31 NOTE — Discharge Summary (Addendum)
[facility] [place] [facility]   PATIENT NAME: Kendra Thomas    MR#:  [id number]  DATE OF BIRTH:  07/07/1923  DATE OF ADMISSION:  07/29/2016 ADMITTING PHYSICIAN: Wyatt Hasteavid K Kathalina Ostermann, MD  DATE OF DISCHARGE: 07/31/16  PRIMARY CARE PHYSICIAN: No PCP Per Patient    ADMISSION DIAGNOSIS:  Closed fracture of multiple pubic rami, left, initial encounter (HCC) [S32.592A]  DISCHARGE DIAGNOSIS:  Active Problems:   Pelvic fracture (HCC) Afib RVR chronic diastolic chf  SECONDARY DIAGNOSIS:   Past Medical History:  Diagnosis Date  . Atrial fibrillation (HCC)   . GERD (gastroesophageal reflux disease)   . Hypertension   . Osteoporosis     HOSPITAL COURSE:  Kendra SimmerJoyce Dibari  is a 82 y.o. female admitted 07/29/2016 with chief complaint Fall . Please see H&P performed by Wyatt Hasteavid K Zyriah Mask, MD for further information. Patient presented after mechanical fall, suffered left pubic rami fracture. Evaluated by orthopedic surgery - no surgical intervention. Noted atrial fibrillation with rapid ventricular response on admission - received IV Cardizem with better control. She did require oxygen during her stay, CXR un revealing, increased oxygen requirement felt to be related to atelectasis and mild pulmonary edema in setting of afib rvr with chronic diastolic chf  PT eval - SNF  DISCHARGE CONDITIONS:   stable  CONSULTS OBTAINED:  Treatment Team:  Wyatt Hasteavid K Joselyn Edling, MD Donato HeinzJames P Hooten, MD  DRUG ALLERGIES:   Allergies  Allergen Reactions  . Penicillins Rash    Has patient had a PCN reaction causing immediate rash, facial/tongue/throat swelling, SOB or lightheadedness with hypotension: no Has patient had a PCN reaction causing severe rash involving mucus membranes or skin necrosis: yes Has patient had a PCN reaction that required hospitalization yes Has patient had a PCN reaction occurring within the last 10 years: no If all of the above answers are "NO", then may proceed with  Cephalosporin use.     DISCHARGE MEDICATIONS:   Current Discharge Medication List    START taking these medications   Details  acetaminophen (TYLENOL) 325 MG tablet Take 2 tablets (650 mg total) by mouth every 6 (six) hours as needed for mild pain (or Fever >/= 101). Qty: 30 tablet, Refills: 0    ALPRAZolam (XANAX) 0.25 MG tablet Take 1 tablet (0.25 mg total) by mouth 3 (three) times daily as needed for anxiety. Qty: 30 tablet, Refills: 0    bisacodyl (DULCOLAX) 5 MG EC tablet Take 1 tablet (5 mg total) by mouth daily as needed for moderate constipation. Qty: 30 tablet, Refills: 0    diltiazem (CARDIZEM) 30 MG tablet Take 1 tablet (30 mg total) by mouth 3 (three) times daily. Qty: 90 tablet, Refills: 0    docusate sodium (COLACE) 100 MG capsule Take 1 capsule (100 mg total) by mouth 2 (two) times daily. Qty: 10 capsule, Refills: 0    ipratropium-albuterol (DUONEB) 0.5-2.5 (3) MG/3ML SOLN Take 3 mLs by nebulization every 4 (four) hours as needed. Qty: 360 mL, Refills: 0    oxyCODONE (OXY IR/ROXICODONE) 5 MG immediate release tablet Take 1 tablet (5 mg total) by mouth every 4 (four) hours as needed for moderate pain. Qty: 30 tablet, Refills: 0    polyethylene glycol (MIRALAX / GLYCOLAX) packet Take 17 g by mouth daily as needed for mild constipation. Qty: 14 each, Refills: 0      CONTINUE these medications which have NOT CHANGED   Details  aspirin EC 81 MG tablet Take 81 mg by mouth  daily.    calcium carbonate (OSCAL) 1500 (600 Ca) MG TABS tablet Take 1,500 mg by mouth 2 (two) times daily with a meal.    Cholecalciferol (VITAMIN D) 2000 units CAPS Take by mouth.    fluticasone (FLONASE) 50 MCG/ACT nasal spray Place 2 sprays into both nostrils daily.    furosemide (LASIX) 20 MG tablet Take 30 mg by mouth.    hydrOXYzine (ATARAX/VISTARIL) 25 MG tablet Take 12.5-25 mg by mouth daily as needed.    metoprolol succinate (TOPROL-XL) 25 MG 24 hr tablet Take 25 mg by mouth 2  (two) times daily.    Multiple Vitamin (MULTIVITAMIN) tablet Take 1 tablet by mouth daily.    warfarin (COUMADIN) 1 MG tablet Take 1-2 mg by mouth daily.         DISCHARGE INSTRUCTIONS:    DIET:  Cardiac diet  DISCHARGE CONDITION:  Stable  ACTIVITY:  Activity as tolerated  OXYGEN:  Home Oxygen: Yes.     Oxygen Delivery: 3 liters/min via Patient connected to nasal cannula oxygen  DISCHARGE LOCATION:  nursing home   If you experience worsening of your admission symptoms, develop shortness of breath, life threatening emergency, suicidal or homicidal thoughts you must seek medical attention immediately by calling 911 or calling your MD immediately  if symptoms less severe.  You Must read complete instructions/literature along with all the possible adverse reactions/side effects for all the Medicines you take and that have been prescribed to you. Take any new Medicines after you have completely understood and accpet all the possible adverse reactions/side effects.   Please note  You were cared for by a hospitalist during your hospital stay. If you have any questions about your discharge medications or the care you received while you were in the hospital after you are discharged, you can call the unit and asked to speak with the hospitalist on call if the hospitalist that took care of you is not available. Once you are discharged, your primary care physician will handle any further medical issues. Please note that NO REFILLS for any discharge medications will be authorized once you are discharged, as it is imperative that you return to your primary care physician (or establish a relationship with a primary care physician if you do not have one) for your aftercare needs so that they can reassess your need for medications and monitor your lab values.    On the day of Discharge:   VITAL SIGNS:  Blood pressure (!) 106/51, pulse 85, temperature 98 F (36.7 C), temperature source  Oral, resp. rate 18, height 5' (1.524 m), weight 59 kg (130 lb), SpO2 (!) 85 %.  I/O:   Intake/Output Summary (Last 24 hours) at 07/31/16 1019 Last data filed at 07/31/16 1006  Gross per 24 hour  Intake              360 ml  Output              500 ml  Net             -140 ml    PHYSICAL EXAMINATION:  GENERAL:  82 y.o.-year-old patient lying in the bed with no acute distress.  EYES: Pupils equal, round, reactive to light and accommodation. No scleral icterus. Extraocular muscles intact.  HEENT: Head atraumatic, normocephalic. Oropharynx and nasopharynx clear.  NECK:  Supple, no jugular venous distention. No thyroid enlargement, no tenderness.  LUNGS: Normal breath sounds bilaterally, no wheezing, rales,rhonchi or crepitation. No use of accessory muscles of  respiration.  CARDIOVASCULAR: S1, S2 irregular rhythm. No murmurs, rubs, or gallops.  ABDOMEN: Soft, non-tender, non-distended. Bowel sounds present. No organomegaly or mass.  EXTREMITIES: No pedal edema, cyanosis, or clubbing.  NEUROLOGIC: Cranial nerves II through XII are intact. Muscle strength 5/5 in all extremities. Sensation intact. Gait not checked.  PSYCHIATRIC: The patient is alert and oriented x 3.  SKIN: No obvious rash, lesion, or ulcer.   DATA REVIEW:   CBC  Recent Labs Lab 07/29/16 1126  WBC 14.4*  HGB 12.2  HCT 36.9  PLT 183    Chemistries   Recent Labs Lab 07/29/16 1126  NA 134*  K 4.3  CL 97*  CO2 27  GLUCOSE 140*  BUN 34*  CREATININE 1.28*  CALCIUM 9.4    Cardiac Enzymes No results for input(s): TROPONINI in the last 168 hours.  Microbiology Results  No results found for this or any previous visit.  RADIOLOGY:  Dg Chest Port 1 View  Result Date: 07/30/2016 CLINICAL DATA:  Atrial fibrillation.  Fall. EXAM: PORTABLE CHEST 1 VIEW COMPARISON:  June 25, 2011 FINDINGS: The study is limited due to the positioning. No pneumothorax. Cardiomegaly identified. No pneumothorax. Multilevel  vertebroplasties. No other acute abnormalities. IMPRESSION: Limited study with no acute abnormality identified. Electronically Signed   By: Gerome Samavid  Williams III M.D   On: 07/30/2016 12:18   Dg Hip Unilat With Pelvis 2-3 Views Left  Result Date: 07/29/2016 CLINICAL DATA:  Fall and left groin pain. EXAM: DG HIP (WITH OR WITHOUT PELVIS) 2-3V LEFT COMPARISON:  09/30/2010 FINDINGS: Displaced fracture of the left superior pubic ramus. Minimally displaced fracture involving the left inferior pubic ramus. The left hip is located. No evidence for a fracture involving the proximal left femur. No gross abnormality to the sacrum. Right hip appears to be grossly intact. IMPRESSION: Fractures of left pubic rami. Electronically Signed   By: Richarda OverlieAdam  Henn M.D.   On: 07/29/2016 12:12   Dg Hip Unilat With Pelvis 2-3 Views Right  Result Date: 07/30/2016 CLINICAL DATA:  Right hip pain. EXAM: DG HIP (WITH OR WITHOUT PELVIS) 2-3V RIGHT COMPARISON:  None. FINDINGS: There is a fracture of the left pubis adjacent to the medial left acetabulum. There is a probable additional fracture of the inferior left pubic ramus. No other evidence of a fracture.  No bone lesions. Hip joints, SI joints and symphysis pubis are normally aligned. Bones are diffusely demineralized. IMPRESSION: 1. Nondisplaced fracture of the left pubic bone adjacent to the left acetabulum with a probable nondisplaced fracture of the left inferior pubic ramus. 2. No other fractures. Specifically, no right proximal femur/hip fracture. No dislocation. Electronically Signed   By: Amie Portlandavid  Ormond M.D.   On: 07/30/2016 12:57     Management plans discussed with the patient, family and they are in agreement.  CODE STATUS:     Code Status Orders        Start     Ordered   07/29/16 1322  Full code  Continuous     07/29/16 1322    Code Status History    Date Active Date Inactive Code Status Order ID Comments User Context   This patient has a current code status  but no historical code status.      TOTAL TIME TAKING CARE OF THIS PATIENT: 33 minutes.    Paddy Neis,  Mardi MainlandDavid K M.D on 07/31/2016 at 10:19 AM  Between 7am to 6pm - Pager - [phone]  After 6pm go to [email or web address] - password [email or web address]  [facility]  [employer] [facility]  Office  [phone]  CC: Primary care physician; No PCP Per Patient

## 2016-07-31 NOTE — Clinical Social Work Note (Signed)
Patient to be d/c'ed today to [facility].  Patient and family agreeable to plans will transport via ems RN to call report to room 203B [phone].  Windell MouldingEric Nykia Turko, MSW, LCSWA Mon-Fri 8a-4:30p [phone]

## 2016-08-01 DIAGNOSIS — M810 Age-related osteoporosis without current pathological fracture: Secondary | ICD-10-CM | POA: Diagnosis not present

## 2016-08-01 DIAGNOSIS — I4891 Unspecified atrial fibrillation: Secondary | ICD-10-CM | POA: Diagnosis not present

## 2016-08-01 DIAGNOSIS — I5032 Chronic diastolic (congestive) heart failure: Secondary | ICD-10-CM | POA: Diagnosis not present

## 2016-08-01 DIAGNOSIS — J9611 Chronic respiratory failure with hypoxia: Secondary | ICD-10-CM | POA: Diagnosis not present

## 2016-08-02 DIAGNOSIS — Z7901 Long term (current) use of anticoagulants: Secondary | ICD-10-CM | POA: Diagnosis not present

## 2016-08-02 DIAGNOSIS — I4891 Unspecified atrial fibrillation: Secondary | ICD-10-CM | POA: Diagnosis not present

## 2016-08-02 LAB — PROTIME-INR
INR: 3.05
PROTHROMBIN TIME: 32.2 s — AB (ref 11.4–15.2)

## 2016-08-04 ENCOUNTER — Encounter
Admission: RE | Admit: 2016-08-04 | Discharge: 2016-08-04 | Disposition: A | Discharge status: Home / Self Care | Payer: No Typology Code available for payment source | Source: Ambulatory Visit | Attending: Internal Medicine | Admitting: Internal Medicine

## 2016-08-04 DIAGNOSIS — I4891 Unspecified atrial fibrillation: Secondary | ICD-10-CM | POA: Diagnosis not present

## 2016-08-04 DIAGNOSIS — Z7901 Long term (current) use of anticoagulants: Secondary | ICD-10-CM | POA: Insufficient documentation

## 2016-08-04 LAB — PROTIME-INR
INR: 1.83
PROTHROMBIN TIME: 21.4 s — AB (ref 11.4–15.2)

## 2016-08-06 DIAGNOSIS — I4891 Unspecified atrial fibrillation: Secondary | ICD-10-CM | POA: Diagnosis not present

## 2016-08-06 DIAGNOSIS — Z7901 Long term (current) use of anticoagulants: Secondary | ICD-10-CM | POA: Diagnosis not present

## 2016-08-06 LAB — PROTIME-INR
INR: 2.02
PROTHROMBIN TIME: 23.2 s — AB (ref 11.4–15.2)

## 2016-08-07 DIAGNOSIS — I4891 Unspecified atrial fibrillation: Secondary | ICD-10-CM | POA: Diagnosis not present

## 2016-08-07 DIAGNOSIS — Z7901 Long term (current) use of anticoagulants: Secondary | ICD-10-CM | POA: Diagnosis not present

## 2016-08-07 LAB — URINALYSIS, COMPLETE (UACMP) WITH MICROSCOPIC
Bacteria, UA: NONE SEEN
Bilirubin Urine: NEGATIVE
GLUCOSE, UA: NEGATIVE mg/dL
KETONES UR: NEGATIVE mg/dL
Nitrite: NEGATIVE
PROTEIN: 100 mg/dL — AB
Specific Gravity, Urine: 1.018 (ref 1.005–1.030)
pH: 5 (ref 5.0–8.0)

## 2016-08-08 ENCOUNTER — Other Ambulatory Visit
Admission: RE | Admit: 2016-08-08 | Discharge: 2016-08-08 | Disposition: A | Discharge status: Home / Self Care | Payer: Commercial Managed Care - HMO | Source: Skilled Nursing Facility | Attending: Internal Medicine | Admitting: Internal Medicine

## 2016-08-08 DIAGNOSIS — Z7901 Long term (current) use of anticoagulants: Secondary | ICD-10-CM | POA: Insufficient documentation

## 2016-08-08 DIAGNOSIS — I4891 Unspecified atrial fibrillation: Secondary | ICD-10-CM | POA: Insufficient documentation

## 2016-08-08 LAB — PROTIME-INR
INR: 2.41
PROTHROMBIN TIME: 26.7 s — AB (ref 11.4–15.2)

## 2016-08-10 DIAGNOSIS — Z7901 Long term (current) use of anticoagulants: Secondary | ICD-10-CM | POA: Diagnosis not present

## 2016-08-10 DIAGNOSIS — I4891 Unspecified atrial fibrillation: Secondary | ICD-10-CM | POA: Diagnosis not present

## 2016-08-10 LAB — PROTIME-INR
INR: 2.93
PROTHROMBIN TIME: 31.2 s — AB (ref 11.4–15.2)

## 2016-08-11 LAB — URINE CULTURE: Culture: >=100000 — AB

## 2016-08-13 DIAGNOSIS — I4891 Unspecified atrial fibrillation: Secondary | ICD-10-CM | POA: Diagnosis not present

## 2016-08-13 DIAGNOSIS — Z7901 Long term (current) use of anticoagulants: Secondary | ICD-10-CM | POA: Diagnosis not present

## 2016-08-13 LAB — PROTIME-INR
INR: 3.18
PROTHROMBIN TIME: 33.3 s — AB (ref 11.4–15.2)

## 2016-08-15 DIAGNOSIS — I4891 Unspecified atrial fibrillation: Secondary | ICD-10-CM | POA: Diagnosis not present

## 2016-08-15 DIAGNOSIS — Z7901 Long term (current) use of anticoagulants: Secondary | ICD-10-CM | POA: Diagnosis not present

## 2016-08-15 LAB — URINALYSIS, COMPLETE (UACMP) WITH MICROSCOPIC
BILIRUBIN URINE: NEGATIVE
Bacteria, UA: NONE SEEN
GLUCOSE, UA: NEGATIVE mg/dL
Hgb urine dipstick: NEGATIVE
KETONES UR: NEGATIVE mg/dL
LEUKOCYTES UA: NEGATIVE
Nitrite: NEGATIVE
PH: 6 (ref 5.0–8.0)
Protein, ur: NEGATIVE mg/dL
SPECIFIC GRAVITY, URINE: 1.013 (ref 1.005–1.030)
SQUAMOUS EPITHELIAL / LPF: NONE SEEN

## 2016-08-15 LAB — PROTIME-INR
INR: 3.63
PROTHROMBIN TIME: 37 s — AB (ref 11.4–15.2)

## 2016-08-16 LAB — URINE CULTURE

## 2016-08-18 DIAGNOSIS — Z7901 Long term (current) use of anticoagulants: Secondary | ICD-10-CM | POA: Diagnosis not present

## 2016-08-18 DIAGNOSIS — I4891 Unspecified atrial fibrillation: Secondary | ICD-10-CM | POA: Diagnosis not present

## 2016-08-18 LAB — URINALYSIS, ROUTINE W REFLEX MICROSCOPIC
Bacteria, UA: NONE SEEN
Bilirubin Urine: NEGATIVE
GLUCOSE, UA: NEGATIVE mg/dL
KETONES UR: NEGATIVE mg/dL
Leukocytes, UA: NEGATIVE
Nitrite: NEGATIVE
PH: 6 (ref 5.0–8.0)
Protein, ur: NEGATIVE mg/dL
SPECIFIC GRAVITY, URINE: 1.015 (ref 1.005–1.030)
Squamous Epithelial / LPF: NONE SEEN

## 2016-08-18 LAB — PROTIME-INR
INR: 2.43
PROTHROMBIN TIME: 26.9 s — AB (ref 11.4–15.2)

## 2016-08-20 DIAGNOSIS — I4891 Unspecified atrial fibrillation: Secondary | ICD-10-CM | POA: Diagnosis not present

## 2016-08-20 DIAGNOSIS — Z7901 Long term (current) use of anticoagulants: Secondary | ICD-10-CM | POA: Diagnosis not present

## 2016-08-20 LAB — PROTIME-INR
INR: 2.66
Prothrombin Time: 28.9 seconds — ABNORMAL HIGH (ref 11.4–15.2)

## 2016-08-20 LAB — URINE CULTURE

## 2016-08-21 DIAGNOSIS — I4891 Unspecified atrial fibrillation: Secondary | ICD-10-CM | POA: Diagnosis not present

## 2016-08-21 DIAGNOSIS — Z7901 Long term (current) use of anticoagulants: Secondary | ICD-10-CM | POA: Diagnosis not present

## 2016-08-21 LAB — COMPREHENSIVE METABOLIC PANEL
ALBUMIN: 3 g/dL — AB (ref 3.5–5.0)
ALT: 15 U/L (ref 14–54)
AST: 23 U/L (ref 15–41)
Alkaline Phosphatase: 192 U/L — ABNORMAL HIGH (ref 38–126)
Anion gap: 6 (ref 5–15)
BILIRUBIN TOTAL: 0.6 mg/dL (ref 0.3–1.2)
BUN: 32 mg/dL — ABNORMAL HIGH (ref 6–20)
CALCIUM: 8.3 mg/dL — AB (ref 8.9–10.3)
CO2: 35 mmol/L — ABNORMAL HIGH (ref 22–32)
Chloride: 92 mmol/L — ABNORMAL LOW (ref 101–111)
Creatinine, Ser: 1.12 mg/dL — ABNORMAL HIGH (ref 0.44–1.00)
GFR calc non Af Amer: 42 mL/min — ABNORMAL LOW (ref >60)
GFR, EST AFRICAN AMERICAN: 48 mL/min — AB (ref >60)
GLUCOSE: 97 mg/dL (ref 65–99)
POTASSIUM: 4 mmol/L (ref 3.5–5.1)
Sodium: 133 mmol/L — ABNORMAL LOW (ref 135–145)
TOTAL PROTEIN: 6.2 g/dL — AB (ref 6.5–8.1)

## 2016-08-21 LAB — URINALYSIS, ROUTINE W REFLEX MICROSCOPIC
Bilirubin Urine: NEGATIVE
GLUCOSE, UA: NEGATIVE mg/dL
Hgb urine dipstick: NEGATIVE
KETONES UR: NEGATIVE mg/dL
LEUKOCYTES UA: NEGATIVE
NITRITE: NEGATIVE
PROTEIN: NEGATIVE mg/dL
Specific Gravity, Urine: 1.006 (ref 1.005–1.030)
pH: 6 (ref 5.0–8.0)

## 2016-08-21 LAB — CBC WITH DIFFERENTIAL/PLATELET
BASOS PCT: 1 %
Basophils Absolute: 0 10*3/uL (ref 0–0.1)
EOS ABS: 0.2 10*3/uL (ref 0–0.7)
Eosinophils Relative: 3 %
HEMATOCRIT: 28.4 % — AB (ref 35.0–47.0)
Hemoglobin: 9.6 g/dL — ABNORMAL LOW (ref 12.0–16.0)
LYMPHS ABS: 1 10*3/uL (ref 1.0–3.6)
Lymphocytes Relative: 13 %
MCH: 30.1 pg (ref 26.0–34.0)
MCHC: 33.6 g/dL (ref 32.0–36.0)
MCV: 89.6 fL (ref 80.0–100.0)
MONOS PCT: 10 %
Monocytes Absolute: 0.8 10*3/uL (ref 0.2–0.9)
NEUTROS ABS: 5.6 10*3/uL (ref 1.4–6.5)
Neutrophils Relative %: 73 %
Platelets: 297 10*3/uL (ref 150–440)
RBC: 3.17 MIL/uL — ABNORMAL LOW (ref 3.80–5.20)
RDW: 14.6 % — AB (ref 11.5–14.5)
WBC: 7.7 10*3/uL (ref 3.6–11.0)

## 2016-08-22 ENCOUNTER — Non-Acute Institutional Stay (SKILLED_NURSING_FACILITY): Payer: Medicare HMO | Admitting: Gerontology

## 2016-08-22 DIAGNOSIS — Z7901 Long term (current) use of anticoagulants: Secondary | ICD-10-CM | POA: Diagnosis not present

## 2016-08-22 DIAGNOSIS — J810 Acute pulmonary edema: Secondary | ICD-10-CM

## 2016-08-22 DIAGNOSIS — I4891 Unspecified atrial fibrillation: Secondary | ICD-10-CM | POA: Diagnosis not present

## 2016-08-22 LAB — PROTIME-INR
INR: 2.87
Prothrombin Time: 30.7 seconds — ABNORMAL HIGH (ref 11.4–15.2)

## 2016-08-22 LAB — URINE CULTURE: CULTURE: NO GROWTH

## 2016-08-25 DIAGNOSIS — I4891 Unspecified atrial fibrillation: Secondary | ICD-10-CM | POA: Diagnosis not present

## 2016-08-25 DIAGNOSIS — Z7901 Long term (current) use of anticoagulants: Secondary | ICD-10-CM | POA: Diagnosis not present

## 2016-08-25 LAB — CBC WITH DIFFERENTIAL/PLATELET
Basophils Absolute: 0 10*3/uL (ref 0–0.1)
Basophils Relative: 1 %
EOS ABS: 0.3 10*3/uL (ref 0–0.7)
EOS PCT: 4 %
HCT: 28 % — ABNORMAL LOW (ref 35.0–47.0)
Hemoglobin: 9.5 g/dL — ABNORMAL LOW (ref 12.0–16.0)
LYMPHS ABS: 1 10*3/uL (ref 1.0–3.6)
LYMPHS PCT: 16 %
MCH: 30 pg (ref 26.0–34.0)
MCHC: 33.8 g/dL (ref 32.0–36.0)
MCV: 88.9 fL (ref 80.0–100.0)
MONOS PCT: 13 %
Monocytes Absolute: 0.8 10*3/uL (ref 0.2–0.9)
Neutro Abs: 4.1 10*3/uL (ref 1.4–6.5)
Neutrophils Relative %: 66 %
PLATELETS: 268 10*3/uL (ref 150–440)
RBC: 3.15 MIL/uL — AB (ref 3.80–5.20)
RDW: 14.7 % — ABNORMAL HIGH (ref 11.5–14.5)
WBC: 6.2 10*3/uL (ref 3.6–11.0)

## 2016-08-25 LAB — COMPREHENSIVE METABOLIC PANEL
ALBUMIN: 2.9 g/dL — AB (ref 3.5–5.0)
ALK PHOS: 171 U/L — AB (ref 38–126)
ALT: 14 U/L (ref 14–54)
ANION GAP: 5 (ref 5–15)
AST: 21 U/L (ref 15–41)
BILIRUBIN TOTAL: 0.7 mg/dL (ref 0.3–1.2)
BUN: 18 mg/dL (ref 6–20)
CALCIUM: 8 mg/dL — AB (ref 8.9–10.3)
CO2: 35 mmol/L — ABNORMAL HIGH (ref 22–32)
Chloride: 96 mmol/L — ABNORMAL LOW (ref 101–111)
Creatinine, Ser: 0.92 mg/dL (ref 0.44–1.00)
GFR calc Af Amer: >60 mL/min (ref >60)
GFR, EST NON AFRICAN AMERICAN: 53 mL/min — AB (ref >60)
GLUCOSE: 83 mg/dL (ref 65–99)
POTASSIUM: 4.3 mmol/L (ref 3.5–5.1)
Sodium: 136 mmol/L (ref 135–145)
TOTAL PROTEIN: 6.3 g/dL — AB (ref 6.5–8.1)

## 2016-08-25 LAB — VITAMIN B12: Vitamin B-12: 1140 pg/mL — ABNORMAL HIGH (ref 180–914)

## 2016-08-25 LAB — PROTIME-INR
INR: 3.61
PROTHROMBIN TIME: 36.9 s — AB (ref 11.4–15.2)

## 2016-08-25 LAB — TSH: TSH: 1.747 u[IU]/mL (ref 0.350–4.500)

## 2016-08-25 LAB — MAGNESIUM: MAGNESIUM: 2.2 mg/dL (ref 1.7–2.4)

## 2016-08-26 LAB — VITAMIN D 25 HYDROXY (VIT D DEFICIENCY, FRACTURES): Vit D, 25-Hydroxy: 45.5 ng/mL (ref 30.0–100.0)

## 2016-08-27 DIAGNOSIS — Z7901 Long term (current) use of anticoagulants: Secondary | ICD-10-CM | POA: Diagnosis not present

## 2016-08-27 DIAGNOSIS — I4891 Unspecified atrial fibrillation: Secondary | ICD-10-CM | POA: Diagnosis not present

## 2016-08-27 LAB — CBC WITH DIFFERENTIAL/PLATELET
Basophils Absolute: 0 10*3/uL (ref 0–0.1)
Basophils Relative: 1 %
EOS PCT: 4 %
Eosinophils Absolute: 0.2 10*3/uL (ref 0–0.7)
HCT: 26.3 % — ABNORMAL LOW (ref 35.0–47.0)
Hemoglobin: 8.9 g/dL — ABNORMAL LOW (ref 12.0–16.0)
LYMPHS ABS: 0.9 10*3/uL — AB (ref 1.0–3.6)
LYMPHS PCT: 16 %
MCH: 30.2 pg (ref 26.0–34.0)
MCHC: 33.9 g/dL (ref 32.0–36.0)
MCV: 89 fL (ref 80.0–100.0)
MONOS PCT: 13 %
Monocytes Absolute: 0.7 10*3/uL (ref 0.2–0.9)
Neutro Abs: 3.9 10*3/uL (ref 1.4–6.5)
Neutrophils Relative %: 66 %
PLATELETS: 254 10*3/uL (ref 150–440)
RBC: 2.95 MIL/uL — AB (ref 3.80–5.20)
RDW: 14.7 % — ABNORMAL HIGH (ref 11.5–14.5)
WBC: 5.8 10*3/uL (ref 3.6–11.0)

## 2016-08-27 LAB — TSH: TSH: 1.92 u[IU]/mL (ref 0.350–4.500)

## 2016-08-27 LAB — PROTIME-INR
INR: 4.3
PROTHROMBIN TIME: 42.4 s — AB (ref 11.4–15.2)

## 2016-08-27 LAB — COMPREHENSIVE METABOLIC PANEL
ALT: 12 U/L — ABNORMAL LOW (ref 14–54)
AST: 22 U/L (ref 15–41)
Albumin: 3 g/dL — ABNORMAL LOW (ref 3.5–5.0)
Alkaline Phosphatase: 150 U/L — ABNORMAL HIGH (ref 38–126)
Anion gap: 5 (ref 5–15)
BILIRUBIN TOTAL: 0.8 mg/dL (ref 0.3–1.2)
BUN: 17 mg/dL (ref 6–20)
CO2: 35 mmol/L — ABNORMAL HIGH (ref 22–32)
CREATININE: 0.89 mg/dL (ref 0.44–1.00)
Calcium: 8 mg/dL — ABNORMAL LOW (ref 8.9–10.3)
Chloride: 93 mmol/L — ABNORMAL LOW (ref 101–111)
GFR calc Af Amer: >60 mL/min (ref >60)
GFR, EST NON AFRICAN AMERICAN: 55 mL/min — AB (ref >60)
Glucose, Bld: 95 mg/dL (ref 65–99)
POTASSIUM: 4 mmol/L (ref 3.5–5.1)
Sodium: 133 mmol/L — ABNORMAL LOW (ref 135–145)
TOTAL PROTEIN: 6.2 g/dL — AB (ref 6.5–8.1)

## 2016-08-27 LAB — MAGNESIUM: Magnesium: 2.1 mg/dL (ref 1.7–2.4)

## 2016-08-27 LAB — VITAMIN B12: VITAMIN B 12: 710 pg/mL (ref 180–914)

## 2016-08-28 LAB — VITAMIN D 25 HYDROXY (VIT D DEFICIENCY, FRACTURES): VIT D 25 HYDROXY: 50.7 ng/mL (ref 30.0–100.0)

## 2016-08-29 DIAGNOSIS — I4891 Unspecified atrial fibrillation: Secondary | ICD-10-CM | POA: Diagnosis not present

## 2016-08-29 DIAGNOSIS — Z7901 Long term (current) use of anticoagulants: Secondary | ICD-10-CM | POA: Diagnosis not present

## 2016-08-29 LAB — PROTIME-INR
INR: 3.5
Prothrombin Time: 36 seconds — ABNORMAL HIGH (ref 11.4–15.2)

## 2016-09-01 ENCOUNTER — Non-Acute Institutional Stay (SKILLED_NURSING_FACILITY): Payer: Medicare HMO | Admitting: Gerontology

## 2016-09-01 DIAGNOSIS — E871 Hypo-osmolality and hyponatremia: Secondary | ICD-10-CM | POA: Diagnosis not present

## 2016-09-01 DIAGNOSIS — Z7901 Long term (current) use of anticoagulants: Secondary | ICD-10-CM | POA: Diagnosis not present

## 2016-09-01 DIAGNOSIS — I4891 Unspecified atrial fibrillation: Secondary | ICD-10-CM | POA: Diagnosis not present

## 2016-09-01 LAB — CBC WITH DIFFERENTIAL/PLATELET
BASOS ABS: 0 10*3/uL (ref 0–0.1)
BASOS PCT: 1 %
EOS ABS: 0.1 10*3/uL (ref 0–0.7)
EOS PCT: 1 %
HCT: 26 % — ABNORMAL LOW (ref 35.0–47.0)
Hemoglobin: 9 g/dL — ABNORMAL LOW (ref 12.0–16.0)
Lymphocytes Relative: 10 %
Lymphs Abs: 0.9 10*3/uL — ABNORMAL LOW (ref 1.0–3.6)
MCH: 30.5 pg (ref 26.0–34.0)
MCHC: 34.6 g/dL (ref 32.0–36.0)
MCV: 88.3 fL (ref 80.0–100.0)
Monocytes Absolute: 1.1 10*3/uL — ABNORMAL HIGH (ref 0.2–0.9)
Monocytes Relative: 13 %
Neutro Abs: 6.3 10*3/uL (ref 1.4–6.5)
Neutrophils Relative %: 75 %
Platelets: 391 10*3/uL (ref 150–440)
RBC: 2.95 MIL/uL — ABNORMAL LOW (ref 3.80–5.20)
RDW: 14.5 % (ref 11.5–14.5)
WBC: 8.3 10*3/uL (ref 3.6–11.0)

## 2016-09-01 LAB — PROTIME-INR
INR: 2.13
Prothrombin Time: 24.2 seconds — ABNORMAL HIGH (ref 11.4–15.2)

## 2016-09-01 LAB — COMPREHENSIVE METABOLIC PANEL
ALT: 17 U/L (ref 14–54)
AST: 29 U/L (ref 15–41)
Albumin: 3.3 g/dL — ABNORMAL LOW (ref 3.5–5.0)
Alkaline Phosphatase: 151 U/L — ABNORMAL HIGH (ref 38–126)
Anion gap: 7 (ref 5–15)
BUN: 22 mg/dL — AB (ref 6–20)
CHLORIDE: 84 mmol/L — AB (ref 101–111)
CO2: 32 mmol/L (ref 22–32)
CREATININE: 1.17 mg/dL — AB (ref 0.44–1.00)
Calcium: 8.4 mg/dL — ABNORMAL LOW (ref 8.9–10.3)
GFR calc Af Amer: 46 mL/min — ABNORMAL LOW (ref >60)
GFR calc non Af Amer: 39 mL/min — ABNORMAL LOW (ref >60)
Glucose, Bld: 146 mg/dL — ABNORMAL HIGH (ref 65–99)
POTASSIUM: 5.2 mmol/L — AB (ref 3.5–5.1)
SODIUM: 123 mmol/L — AB (ref 135–145)
Total Bilirubin: 0.9 mg/dL (ref 0.3–1.2)
Total Protein: 6.9 g/dL (ref 6.5–8.1)

## 2016-09-01 LAB — INFLUENZA PANEL BY PCR (TYPE A & B)
INFLAPCR: NEGATIVE
INFLBPCR: NEGATIVE

## 2016-09-02 ENCOUNTER — Non-Acute Institutional Stay (SKILLED_NURSING_FACILITY): Payer: Medicare Other | Admitting: Gerontology

## 2016-09-02 DIAGNOSIS — Z452 Encounter for adjustment and management of vascular access device: Secondary | ICD-10-CM | POA: Diagnosis not present

## 2016-09-02 DIAGNOSIS — Z515 Encounter for palliative care: Secondary | ICD-10-CM | POA: Diagnosis not present

## 2016-09-02 DIAGNOSIS — R4189 Other symptoms and signs involving cognitive functions and awareness: Secondary | ICD-10-CM | POA: Diagnosis not present

## 2016-09-03 NOTE — Progress Notes (Signed)
Location:      Place of Service:  SNF (31) Provider:  Toni Arthurs, NP-C  No PCP Per Patient  Patient Care Team: No Pcp Per Patient as PCP - General (General Practice)  Extended Emergency Contact Information Primary Emergency Contact: Long,Janice M Address: [address]          [place], [place] [address] [place] Phone: [phone] Relation: Friend Secondary Emergency Contact: Elder [place] Phone: [phone] Relation: Niece  Code Status:  dnr Goals of care: Advanced Directive information Advanced Directives 07/29/2016  Does Patient Have a Medical Advance Directive? No  Would patient like information on creating a medical advance directive? No - Patient declined     Chief Complaint  Patient presents with  . Acute Visit    HPI:  Pt is a 83 y.o. female seen today for an acute visit for cough, congestion. Pt c/o increased anxiety. Increased audible wheezing. Decreased oxygen saturations. Afebrile. Pt denies n/v/d/f/c/cp/sob/ha/abd pain/dizziness. Pt denies facial/ sinus pain or congestion. VSS except O2 sats. No other complaints.    Past Medical History:  Diagnosis Date  . Atrial fibrillation ([place])   . GERD (gastroesophageal reflux disease)   . Hypertension   . Osteoporosis    Past Surgical History:  Procedure Laterality Date  . T10, T11 kyphoplasty    . T12,  L1 kyphoplasty      Allergies  Allergen Reactions  . Alendronate     Other reaction(s): Abdominal Pain  . Diltiazem     Other reaction(s): Hallucination  . Hydrocodone-Acetaminophen     Other reaction(s): Hallucination  . Tramadol     Other reaction(s): Abdominal Pain  . Penicillins Rash    Has patient had a PCN reaction causing immediate rash, facial/tongue/throat swelling, SOB or lightheadedness with hypotension: no Has patient had a PCN reaction causing severe rash involving mucus membranes or skin necrosis: yes Has patient had a PCN reaction that  required hospitalization yes Has patient had a PCN reaction occurring within the last 10 years: no If all of the above answers are "NO", then may proceed with Cephalosporin use.     Allergies as of 08/22/2016      Reactions   Penicillins Rash   Has patient had a PCN reaction causing immediate rash, facial/tongue/throat swelling, SOB or lightheadedness with hypotension: no Has patient had a PCN reaction causing severe rash involving mucus membranes or skin necrosis: yes Has patient had a PCN reaction that required hospitalization yes Has patient had a PCN reaction occurring within the last 10 years: no If all of the above answers are "NO", then may proceed with Cephalosporin use.      Medication List       Accurate as of 08/22/16 11:59 PM. Always use your most recent med list.          acetaminophen 325 MG tablet Commonly known as:  TYLENOL Take 2 tablets (650 mg total) by mouth every 6 (six) hours as needed for mild pain (or Fever >/= 101).   ALPRAZolam 0.25 MG tablet Commonly known as:  XANAX Take 1 tablet (0.25 mg total) by mouth 3 (three) times daily as needed for anxiety.   aspirin EC 81 MG tablet Take 81 mg by mouth daily.   bisacodyl 5 MG EC tablet Commonly known as:  DULCOLAX Take 1 tablet (5 mg total) by mouth daily as needed for moderate constipation.   calcium carbonate 1500 (600 Ca) MG Tabs tablet Commonly known  as:  OSCAL Take 1,500 mg by mouth 2 (two) times daily with a meal.   diltiazem 30 MG tablet Commonly known as:  CARDIZEM Take 1 tablet (30 mg total) by mouth 3 (three) times daily.   docusate sodium 100 MG capsule Commonly known as:  COLACE Take 1 capsule (100 mg total) by mouth 2 (two) times daily.   fluticasone 50 MCG/ACT nasal spray Commonly known as:  FLONASE Place 2 sprays into both nostrils daily.   furosemide 20 MG tablet Commonly known as:  LASIX Take 30 mg by mouth.   hydrOXYzine 25 MG tablet Commonly known as:   ATARAX/VISTARIL Take 12.5-25 mg by mouth daily as needed.   ipratropium-albuterol 0.5-2.5 (3) MG/3ML Soln Commonly known as:  DUONEB Take 3 mLs by nebulization every 4 (four) hours as needed.   metoprolol succinate 25 MG 24 hr tablet Commonly known as:  TOPROL-XL Take 25 mg by mouth 2 (two) times daily.   multivitamin tablet Take 1 tablet by mouth daily.   oxyCODONE 5 MG immediate release tablet Commonly known as:  Oxy IR/ROXICODONE Take 1 tablet (5 mg total) by mouth every 4 (four) hours as needed for moderate pain.   polyethylene glycol packet Commonly known as:  MIRALAX / GLYCOLAX Take 17 g by mouth daily as needed for mild constipation.   Vitamin D 2000 units Caps Take by mouth.   warfarin 1 MG tablet Commonly known as:  COUMADIN Take 1-2 mg by mouth daily.       Review of Systems  Constitutional: Negative for activity change, appetite change, chills, diaphoresis and fever.  HENT: Negative for congestion, facial swelling, postnasal drip, sinus pain, sinus pressure, sneezing, sore throat, trouble swallowing and voice change.   Eyes: Negative for redness.  Respiratory: Positive for cough and wheezing. Negative for apnea, choking, chest tightness and shortness of breath.   Cardiovascular: Negative for chest pain, palpitations and leg swelling.  Gastrointestinal: Negative for abdominal distention, abdominal pain, constipation, diarrhea and nausea.  Genitourinary: Negative for difficulty urinating, dysuria, frequency and urgency.  Musculoskeletal: Positive for arthralgias (typical arthritis). Negative for back pain, gait problem and myalgias.  Skin: Negative for color change, pallor, rash and wound.  Neurological: Negative for dizziness, tremors, syncope, speech difficulty, weakness, numbness and headaches.  Psychiatric/Behavioral: Negative for agitation and behavioral problems.  All other systems reviewed and are negative.    There is no immunization history on file  for this patient. There are no preventive care reminders to display for this patient. No flowsheet data found. Functional Status Survey:    Vitals:   08/22/16 0600  BP: 134/69  Pulse: 65  Resp: 16  Temp: 97.8 F (36.6 C)  SpO2: 97%  Weight: 137 lb 9.6 oz (62.4 kg)   Body mass index is 26.87 kg/m. Physical Exam  Constitutional: She is oriented to person, place, and time. Vital signs are normal. She appears well-developed and well-nourished. She is active and cooperative. She does not appear ill. No distress. Nasal cannula in place.  HENT:  Head: Normocephalic and atraumatic.  Mouth/Throat: Uvula is midline, oropharynx is clear and moist and mucous membranes are normal. Mucous membranes are not pale, not dry and not cyanotic.  Eyes: Conjunctivae, EOM and lids are normal. Pupils are equal, round, and reactive to light.  Neck: Trachea normal, normal range of motion and full passive range of motion without pain. Neck supple. No JVD present. No tracheal deviation, no edema and no erythema present. No thyromegaly present.  Cardiovascular:  Normal rate, regular rhythm, normal heart sounds, intact distal pulses and normal pulses.  Exam reveals no gallop, no distant heart sounds and no friction rub.   No murmur heard. Pulmonary/Chest: Effort normal. No accessory muscle usage. No respiratory distress. She has decreased breath sounds in the right lower field. She has wheezes in the right upper field and the left upper field. She has no rhonchi. She has rales in the right upper field, the right middle field, the left upper field and the left middle field. She exhibits no tenderness.  Abdominal: Normal appearance and bowel sounds are normal. She exhibits no distension and no ascites. There is no tenderness.  Musculoskeletal: Normal range of motion. She exhibits no edema or tenderness.  Expected osteoarthritis, stiffness  Neurological: She is alert and oriented to person, place, and time. She has  normal strength.  Skin: Skin is warm, dry and intact. She is not diaphoretic. No cyanosis. No pallor. Nails show no clubbing.  Psychiatric: She has a normal mood and affect. Her speech is normal and behavior is normal. Judgment and thought content normal. Cognition and memory are normal.  Nursing note and vitals reviewed.   Labs reviewed:  Recent Labs  08/25/16 0700 08/27/16 0510 09/01/16 1134  NA 136 133* 123*  K 4.3 4.0 5.2*  CL 96* 93* 84*  CO2 35* 35* 32  GLUCOSE 83 95 146*  BUN 18 17 22*  CREATININE 0.92 0.89 1.17*  CALCIUM 8.0* 8.0* 8.4*  MG 2.2 2.1  --     Recent Labs  08/25/16 0700 08/27/16 0510 09/01/16 1134  AST 21 22 29   ALT 14 12* 17  ALKPHOS 171* 150* 151*  BILITOT 0.7 0.8 0.9  PROT 6.3* 6.2* 6.9  ALBUMIN 2.9* 3.0* 3.3*    Recent Labs  08/25/16 0700 08/27/16 0510 09/01/16 1134  WBC 6.2 5.8 8.3  NEUTROABS 4.1 3.9 6.3  HGB 9.5* 8.9* 9.0*  HCT 28.0* 26.3* 26.0*  MCV 88.9 89.0 88.3  PLT 268 254 391   Lab Results  Component Value Date   TSH 1.920 08/27/2016   No results found for: HGBA1C No results found for: CHOL, HDL, LDLCALC, LDLDIRECT, TRIG, CHOLHDL  Significant Diagnostic Results in last 30 days:  No results found.  Assessment/Plan 1. Acute pulmonary edema (HCC)  Lasix 40 mg IM x 1 now  O2 2L [place]  Increase PO lasix to 40 mg daily  Family/ staff Communication:   Total Time:  Documentation:  Face to Face:  Family/Phone:   Labs/tests ordered:  2-V CXR, cbc, met c, PT/INR  Medication list reviewed and assessed for continued appropriateness.  Vikki Ports, NP-C Geriatrics [facility] [phone], [place] [address] Cell Phone (Mon-Fri 8am-5pm):  [phone] On Call:  [phone] & follow prompts after 5pm & weekends Office Phone:  [phone] Office Fax:  [phone]

## 2016-09-04 DEATH — deceased

## 2016-09-12 NOTE — Progress Notes (Signed)
Location:      Place of Service:  SNF (31) Provider:  Toni Arthurs, NP-C  No PCP Per Patient  Patient Care Team: No Pcp Per Patient as PCP - General (General Practice)  Extended Emergency Contact Information Primary Emergency Contact: Long,Janice M Address: [address]          [place], [place] [address] [place] Phone: [phone] Relation: Friend Secondary Emergency Contact: Elder [place] Phone: [phone] Relation: Niece  Code Status:  dnr Goals of care: Advanced Directive information Advanced Directives 07/29/2016  Does Patient Have a Medical Advance Directive? No  Would patient like information on creating a medical advance directive? No - Patient declined     Chief Complaint  Patient presents with  . Acute Visit    HPI:  Pt is a 83 y.o. female seen today for an acute visit for Unresponsive state. I was called to the room by nursing d/t change in mental status. Nursing reports that earlier this morning, pt was lethargic but arousable and conversant. Now, pt is essentially unresponsive. Will flutter eyes to the sound of her name. Hands are cool and mottling. Feet are more cool than her normal. Respirations are slightly labored and irregular. Heart irregular and tachy. No signs of abdominal pain or pain anywhere else pt was touched. Lung sounds clear but diminished. Unable to obtain ROS d/t pt AMS. Called and spoke at length with the nephew. He opted for comfort care only. No hospitalization. No more rehab. Nephew opted to wave rehab days and have pt opened to Hospice services. VSS. No other complaints.     Past Medical History:  Diagnosis Date  . Atrial fibrillation ([place])   . GERD (gastroesophageal reflux disease)   . Hypertension   . Osteoporosis    Past Surgical History:  Procedure Laterality Date  . T10, T11 kyphoplasty    . T12,  L1 kyphoplasty      Allergies  Allergen Reactions  . Alendronate     Other  reaction(s): Abdominal Pain  . Diltiazem     Other reaction(s): Hallucination  . Hydrocodone-Acetaminophen     Other reaction(s): Hallucination  . Tramadol     Other reaction(s): Abdominal Pain  . Penicillins Rash    Has patient had a PCN reaction causing immediate rash, facial/tongue/throat swelling, SOB or lightheadedness with hypotension: no Has patient had a PCN reaction causing severe rash involving mucus membranes or skin necrosis: yes Has patient had a PCN reaction that required hospitalization yes Has patient had a PCN reaction occurring within the last 10 years: no If all of the above answers are "NO", then may proceed with Cephalosporin use.     Allergies as of 09/02/2016      Reactions   Alendronate    Other reaction(s): Abdominal Pain   Diltiazem    Other reaction(s): Hallucination   Hydrocodone-acetaminophen    Other reaction(s): Hallucination   Tramadol    Other reaction(s): Abdominal Pain   Penicillins Rash   Has patient had a PCN reaction causing immediate rash, facial/tongue/throat swelling, SOB or lightheadedness with hypotension: no Has patient had a PCN reaction causing severe rash involving mucus membranes or skin necrosis: yes Has patient had a PCN reaction that required hospitalization yes Has patient had a PCN reaction occurring within the last 10 years: no If all of the above answers are "NO", then may proceed with Cephalosporin use.      Medication List  Accurate as of 09/02/16 11:59 PM. Always use your most recent med list.          acetaminophen 325 MG tablet Commonly known as:  TYLENOL Take 2 tablets (650 mg total) by mouth every 6 (six) hours as needed for mild pain (or Fever >/= 101).   ALPRAZolam 0.25 MG tablet Commonly known as:  XANAX Take 1 tablet (0.25 mg total) by mouth 3 (three) times daily as needed for anxiety.   aspirin EC 81 MG tablet Take 81 mg by mouth daily.   bisacodyl 5 MG EC tablet Commonly known as:   DULCOLAX Take 1 tablet (5 mg total) by mouth daily as needed for moderate constipation.   calcium carbonate 1500 (600 Ca) MG Tabs tablet Commonly known as:  OSCAL Take 1,500 mg by mouth 2 (two) times daily with a meal.   diltiazem 30 MG tablet Commonly known as:  CARDIZEM Take 1 tablet (30 mg total) by mouth 3 (three) times daily.   docusate sodium 100 MG capsule Commonly known as:  COLACE Take 1 capsule (100 mg total) by mouth 2 (two) times daily.   fluticasone 50 MCG/ACT nasal spray Commonly known as:  FLONASE Place 2 sprays into both nostrils daily.   furosemide 20 MG tablet Commonly known as:  LASIX Take 30 mg by mouth.   hydrOXYzine 25 MG tablet Commonly known as:  ATARAX/VISTARIL Take 12.5-25 mg by mouth daily as needed.   ipratropium-albuterol 0.5-2.5 (3) MG/3ML Soln Commonly known as:  DUONEB Take 3 mLs by nebulization every 4 (four) hours as needed.   metoprolol succinate 25 MG 24 hr tablet Commonly known as:  TOPROL-XL Take 25 mg by mouth 2 (two) times daily.   multivitamin tablet Take 1 tablet by mouth daily.   oxyCODONE 5 MG immediate release tablet Commonly known as:  Oxy IR/ROXICODONE Take 1 tablet (5 mg total) by mouth every 4 (four) hours as needed for moderate pain.   polyethylene glycol packet Commonly known as:  MIRALAX / GLYCOLAX Take 17 g by mouth daily as needed for mild constipation.   Vitamin D 2000 units Caps Take by mouth.   warfarin 1 MG tablet Commonly known as:  COUMADIN Take 1-2 mg by mouth daily.       Review of Systems  Constitutional: Positive for activity change and appetite change. Negative for chills, diaphoresis and fever.  Respiratory: Positive for apnea.   Gastrointestinal: Negative for abdominal distention, abdominal pain, constipation, diarrhea and nausea.  Genitourinary: Negative for difficulty urinating, dysuria, frequency and urgency.  Musculoskeletal: Negative for arthralgias (typical arthritis), back pain, gait  problem and myalgias.  Skin: Negative for color change, pallor, rash and wound.  Neurological: Negative for dizziness, tremors, syncope, speech difficulty, weakness, numbness and headaches.  Psychiatric/Behavioral: Negative for agitation and behavioral problems.  All other systems reviewed and are negative.    There is no immunization history on file for this patient. Pertinent  Health Maintenance Due  Topic Date Due  . DEXA SCAN  06/15/1990  . PNA vac Low Risk Adult (1 of 2 - PCV13) 06/15/1990  . INFLUENZA VACCINE  03/04/2016   No flowsheet data found. Functional Status Survey:    Vitals:   09/02/16 0530  BP: 120/73  Pulse: (!) 110  Resp: 20  Temp: 98.1 F (36.7 C)  SpO2: 93%   There is no height or weight on file to calculate BMI. Physical Exam  Constitutional: She is oriented to person, place, and time. Vital signs are  normal. She appears well-developed and well-nourished. She appears lethargic. She is active. She appears ill. No distress. Nasal cannula in place.  HENT:  Head: Normocephalic and atraumatic.  Mouth/Throat: Uvula is midline, oropharynx is clear and moist and mucous membranes are normal. Mucous membranes are not pale, not dry and not cyanotic.  Eyes: Conjunctivae, EOM and lids are normal. Pupils are equal, round, and reactive to light.  Neck: Trachea normal, normal range of motion and full passive range of motion without pain. Neck supple. No JVD present. No tracheal deviation, no edema and no erythema present. No thyromegaly present.  Cardiovascular: Normal heart sounds and intact distal pulses.  An irregular rhythm present. Tachycardia present.  Exam reveals no gallop, no distant heart sounds and no friction rub.   No murmur heard. Pulses:      Dorsalis pedis pulses are 0 on the right side, and 0 on the left side.  Pulmonary/Chest: Effort normal. No accessory muscle usage. No respiratory distress. She has decreased breath sounds in the right lower field and  the left lower field. She has no wheezes. She has no rhonchi. She has no rales. She exhibits no tenderness.  Abdominal: Soft. Normal appearance. She exhibits no distension and no ascites. Bowel sounds are decreased. There is no tenderness.  Musculoskeletal: Normal range of motion. She exhibits no edema or tenderness.  Expected osteoarthritis, stiffness  Neurological: She is oriented to person, place, and time. She has normal strength. She appears lethargic.  Skin: Skin is warm, dry and intact. She is not diaphoretic. No cyanosis. No pallor. Nails show no clubbing.  Psychiatric: She has a normal mood and affect. Her speech is normal and behavior is normal. Judgment and thought content normal. Cognition and memory are normal.  Nursing note and vitals reviewed.   Labs reviewed:  Recent Labs  08/25/16 0700 08/27/16 0510 09/01/16 1134  NA 136 133* 123*  K 4.3 4.0 5.2*  CL 96* 93* 84*  CO2 35* 35* 32  GLUCOSE 83 95 146*  BUN 18 17 22*  CREATININE 0.92 0.89 1.17*  CALCIUM 8.0* 8.0* 8.4*  MG 2.2 2.1  --     Recent Labs  08/25/16 0700 08/27/16 0510 09/01/16 1134  AST 21 22 29   ALT 14 12* 17  ALKPHOS 171* 150* 151*  BILITOT 0.7 0.8 0.9  PROT 6.3* 6.2* 6.9  ALBUMIN 2.9* 3.0* 3.3*    Recent Labs  08/25/16 0700 08/27/16 0510 09/01/16 1134  WBC 6.2 5.8 8.3  NEUTROABS 4.1 3.9 6.3  HGB 9.5* 8.9* 9.0*  HCT 28.0* 26.3* 26.0*  MCV 88.9 89.0 88.3  PLT 268 254 391   Lab Results  Component Value Date   TSH 1.920 08/27/2016   No results found for: HGBA1C No results found for: CHOL, HDL, LDLCALC, LDLDIRECT, TRIG, CHOLHDL  Significant Diagnostic Results in last 30 days:  No results found.  Assessment/Plan 1. Unresponsive state  Urgent referral to Hospice for EOL Care  2. Encounter for dying care  Morphine 20 mg/ mL- 0.25-0.5 mL po Q 1 hour prn pain, dyspnea, restlessness- 30 mL bottle, no refill  Emotional, educational and spiritual support for family  DNR  Comfort  care only  Oxygen 2-5 liters/ minute [place]- titrate for comfort  Family/ staff Communication:   Total Time: 60 minutes  Documentation: 30 minutes  Face to Face: Met with nephew in person for 10 minutes  Family/Phone: spoke with nephew via telephone for 20 minutes   Labs/tests ordered:    Medication  list reviewed and assessed for continued appropriateness.  Vikki Ports, NP-C Geriatrics [facility] [phone], [place] [address] Cell Phone (Mon-Fri 8am-5pm):  [phone] On Call:  [phone] & follow prompts after 5pm & weekends Office Phone:  [phone] Office Fax:  [phone]

## 2016-09-15 NOTE — Progress Notes (Signed)
Location:      Place of Service:  SNF (31) Provider:  Toni Arthurs, NP-C  No PCP Per Patient  Patient Care Team: No Pcp Per Patient as PCP - General (General Practice)  Extended Emergency Contact Information Primary Emergency Contact: Long,Janice M Address: [address]          [place], [place] [address] [place] Phone: [phone] Relation: Friend Secondary Emergency Contact: Elder [place] Phone: [phone] Relation: Niece  Code Status:  DNR Goals of care: Advanced Directive information Advanced Directives 07/29/2016  Does Patient Have a Medical Advance Directive? No  Would patient like information on creating a medical advance directive? No - Patient declined     Chief Complaint  Patient presents with  . Acute Visit    HPI:  Pt is a 83 y.o. female seen today for an acute visit for increased weakness and confusion. Pt is more weak than she has been of late. She seems more confused, slow to respond. Pt appears more pale than usual, as well. Pt reports she feels ok, just weak. Denies n/v/d/f/c/cp/sob/ha/abd pain/dizziness. Pt is very quiet, sleeping a lot. VSS. No other complaints.    Past Medical History:  Diagnosis Date  . Atrial fibrillation ([place])   . GERD (gastroesophageal reflux disease)   . Hypertension   . Osteoporosis    Past Surgical History:  Procedure Laterality Date  . T10, T11 kyphoplasty    . T12,  L1 kyphoplasty      Allergies  Allergen Reactions  . Alendronate     Other reaction(s): Abdominal Pain  . Diltiazem     Other reaction(s): Hallucination  . Hydrocodone-Acetaminophen     Other reaction(s): Hallucination  . Tramadol     Other reaction(s): Abdominal Pain  . Penicillins Rash    Has patient had a PCN reaction causing immediate rash, facial/tongue/throat swelling, SOB or lightheadedness with hypotension: no Has patient had a PCN reaction causing severe rash involving mucus membranes or  skin necrosis: yes Has patient had a PCN reaction that required hospitalization yes Has patient had a PCN reaction occurring within the last 10 years: no If all of the above answers are "NO", then may proceed with Cephalosporin use.     Allergies as of 09/01/2016      Reactions   Penicillins Rash   Has patient had a PCN reaction causing immediate rash, facial/tongue/throat swelling, SOB or lightheadedness with hypotension: no Has patient had a PCN reaction causing severe rash involving mucus membranes or skin necrosis: yes Has patient had a PCN reaction that required hospitalization yes Has patient had a PCN reaction occurring within the last 10 years: no If all of the above answers are "NO", then may proceed with Cephalosporin use.      Medication List       Accurate as of 09/01/16 11:59 PM. Always use your most recent med list.          acetaminophen 325 MG tablet Commonly known as:  TYLENOL Take 2 tablets (650 mg total) by mouth every 6 (six) hours as needed for mild pain (or Fever >/= 101).   ALPRAZolam 0.25 MG tablet Commonly known as:  XANAX Take 1 tablet (0.25 mg total) by mouth 3 (three) times daily as needed for anxiety.   aspirin EC 81 MG tablet Take 81 mg by mouth daily.   bisacodyl 5 MG EC tablet Commonly known as:  DULCOLAX Take 1 tablet (5 mg total) by  mouth daily as needed for moderate constipation.   calcium carbonate 1500 (600 Ca) MG Tabs tablet Commonly known as:  OSCAL Take 1,500 mg by mouth 2 (two) times daily with a meal.   diltiazem 30 MG tablet Commonly known as:  CARDIZEM Take 1 tablet (30 mg total) by mouth 3 (three) times daily.   docusate sodium 100 MG capsule Commonly known as:  COLACE Take 1 capsule (100 mg total) by mouth 2 (two) times daily.   fluticasone 50 MCG/ACT nasal spray Commonly known as:  FLONASE Place 2 sprays into both nostrils daily.   furosemide 20 MG tablet Commonly known as:  LASIX Take 30 mg by mouth.     hydrOXYzine 25 MG tablet Commonly known as:  ATARAX/VISTARIL Take 12.5-25 mg by mouth daily as needed.   ipratropium-albuterol 0.5-2.5 (3) MG/3ML Soln Commonly known as:  DUONEB Take 3 mLs by nebulization every 4 (four) hours as needed.   metoprolol succinate 25 MG 24 hr tablet Commonly known as:  TOPROL-XL Take 25 mg by mouth 2 (two) times daily.   multivitamin tablet Take 1 tablet by mouth daily.   oxyCODONE 5 MG immediate release tablet Commonly known as:  Oxy IR/ROXICODONE Take 1 tablet (5 mg total) by mouth every 4 (four) hours as needed for moderate pain.   polyethylene glycol packet Commonly known as:  MIRALAX / GLYCOLAX Take 17 g by mouth daily as needed for mild constipation.   Vitamin D 2000 units Caps Take by mouth.   warfarin 1 MG tablet Commonly known as:  COUMADIN Take 1-2 mg by mouth daily.       Review of Systems  Constitutional: Negative for activity change, appetite change, chills, diaphoresis and fever.  HENT: Negative for congestion, facial swelling, postnasal drip, sinus pain, sinus pressure, sneezing, sore throat, trouble swallowing and voice change.   Eyes: Negative for redness.  Respiratory: Positive for cough and wheezing. Negative for apnea, choking, chest tightness and shortness of breath.   Cardiovascular: Negative for chest pain, palpitations and leg swelling.  Gastrointestinal: Negative for abdominal distention, abdominal pain, constipation, diarrhea and nausea.  Genitourinary: Negative for difficulty urinating, dysuria, frequency and urgency.  Musculoskeletal: Positive for arthralgias (typical arthritis). Negative for back pain, gait problem and myalgias.  Skin: Negative for color change, pallor, rash and wound.  Neurological: Negative for dizziness, tremors, syncope, speech difficulty, weakness, numbness and headaches.  Psychiatric/Behavioral: Negative for agitation and behavioral problems.  All other systems reviewed and are  negative.    There is no immunization history on file for this patient. Pertinent  Health Maintenance Due  Topic Date Due  . DEXA SCAN  06/15/1990  . PNA vac Low Risk Adult (1 of 2 - PCV13) 06/15/1990  . INFLUENZA VACCINE  03/04/2016   No flowsheet data found. Functional Status Survey:    Vitals:   09/01/16 0500  BP: 134/61  Pulse: 89  Resp: 18  Temp: 97.9 F (36.6 C)  SpO2: 93%   There is no height or weight on file to calculate BMI. Physical Exam  Constitutional: She is oriented to person, place, and time. Vital signs are normal. She appears well-developed and well-nourished. She is active and cooperative. She does not appear ill. No distress. Nasal cannula in place.  HENT:  Head: Normocephalic and atraumatic.  Mouth/Throat: Uvula is midline, oropharynx is clear and moist and mucous membranes are normal. Mucous membranes are not pale, not dry and not cyanotic.  Eyes: Conjunctivae, EOM and lids are normal. Pupils  are equal, round, and reactive to light.  Neck: Trachea normal, normal range of motion and full passive range of motion without pain. Neck supple. No JVD present. No tracheal deviation, no edema and no erythema present. No thyromegaly present.  Cardiovascular: Normal rate, regular rhythm, normal heart sounds, intact distal pulses and normal pulses.  Exam reveals no gallop, no distant heart sounds and no friction rub.   No murmur heard. Pulmonary/Chest: Effort normal. No accessory muscle usage. No respiratory distress. She has decreased breath sounds in the right lower field. She has wheezes in the right upper field and the left upper field. She has no rhonchi. She has rales in the right upper field, the right middle field, the left upper field and the left middle field. She exhibits no tenderness.  Abdominal: Normal appearance and bowel sounds are normal. She exhibits no distension and no ascites. There is no tenderness.  Musculoskeletal: Normal range of motion. She  exhibits no edema or tenderness.  Expected osteoarthritis, stiffness  Neurological: She is alert and oriented to person, place, and time. She has normal strength.  Skin: Skin is warm, dry and intact. She is not diaphoretic. No cyanosis. No pallor. Nails show no clubbing.  Psychiatric: She has a normal mood and affect. Her speech is normal and behavior is normal. Judgment and thought content normal. Cognition and memory are normal.  Nursing note and vitals reviewed.   Labs reviewed:  Recent Labs  08/25/16 0700 08/27/16 0510 09/01/16 1134  NA 136 133* 123*  K 4.3 4.0 5.2*  CL 96* 93* 84*  CO2 35* 35* 32  GLUCOSE 83 95 146*  BUN 18 17 22*  CREATININE 0.92 0.89 1.17*  CALCIUM 8.0* 8.0* 8.4*  MG 2.2 2.1  --     Recent Labs  08/25/16 0700 08/27/16 0510 09/01/16 1134  AST 21 22 29   ALT 14 12* 17  ALKPHOS 171* 150* 151*  BILITOT 0.7 0.8 0.9  PROT 6.3* 6.2* 6.9  ALBUMIN 2.9* 3.0* 3.3*    Recent Labs  08/25/16 0700 08/27/16 0510 09/01/16 1134  WBC 6.2 5.8 8.3  NEUTROABS 4.1 3.9 6.3  HGB 9.5* 8.9* 9.0*  HCT 28.0* 26.3* 26.0*  MCV 88.9 89.0 88.3  PLT 268 254 391   Lab Results  Component Value Date   TSH 1.920 08/27/2016   No results found for: HGBA1C No results found for: CHOL, HDL, LDLCALC, LDLDIRECT, TRIG, CHOLHDL  Significant Diagnostic Results in last 30 days:  No results found.  Assessment/Plan 1. Hyponatremia  Insert peripheral IV for infusion of fluids  Begin 0.9% NS 250 mL bolus over 1 hour, then  0.9% NS at 100 ml/hr infusion  Gatorade on lunch and supper trays  1 liter/ day free water restriction  Family/ staff Communication:   Total Time:  Documentation:  Face to Face:  Family/Phone:   Labs/tests ordered:  Cbc, met c, flu swab  Medication list reviewed and assessed for continued appropriateness.  Vikki Ports, NP-C Geriatrics [facility] [phone], [place] [address] Cell Phone  (Mon-Fri 8am-5pm):  [phone] On Call:  [phone] & follow prompts after 5pm & weekends Office Phone:  [phone] Office Fax:  [phone]

## 2017-08-27 IMAGING — DX DG CHEST 1V PORT
1 series · 1 of 1 positions shown · non-contrast
Comparison: June 25, 2011

CLINICAL DATA: Atrial fibrillation.  Fall.

EXAM:
PORTABLE CHEST 1 VIEW

[chest ap]
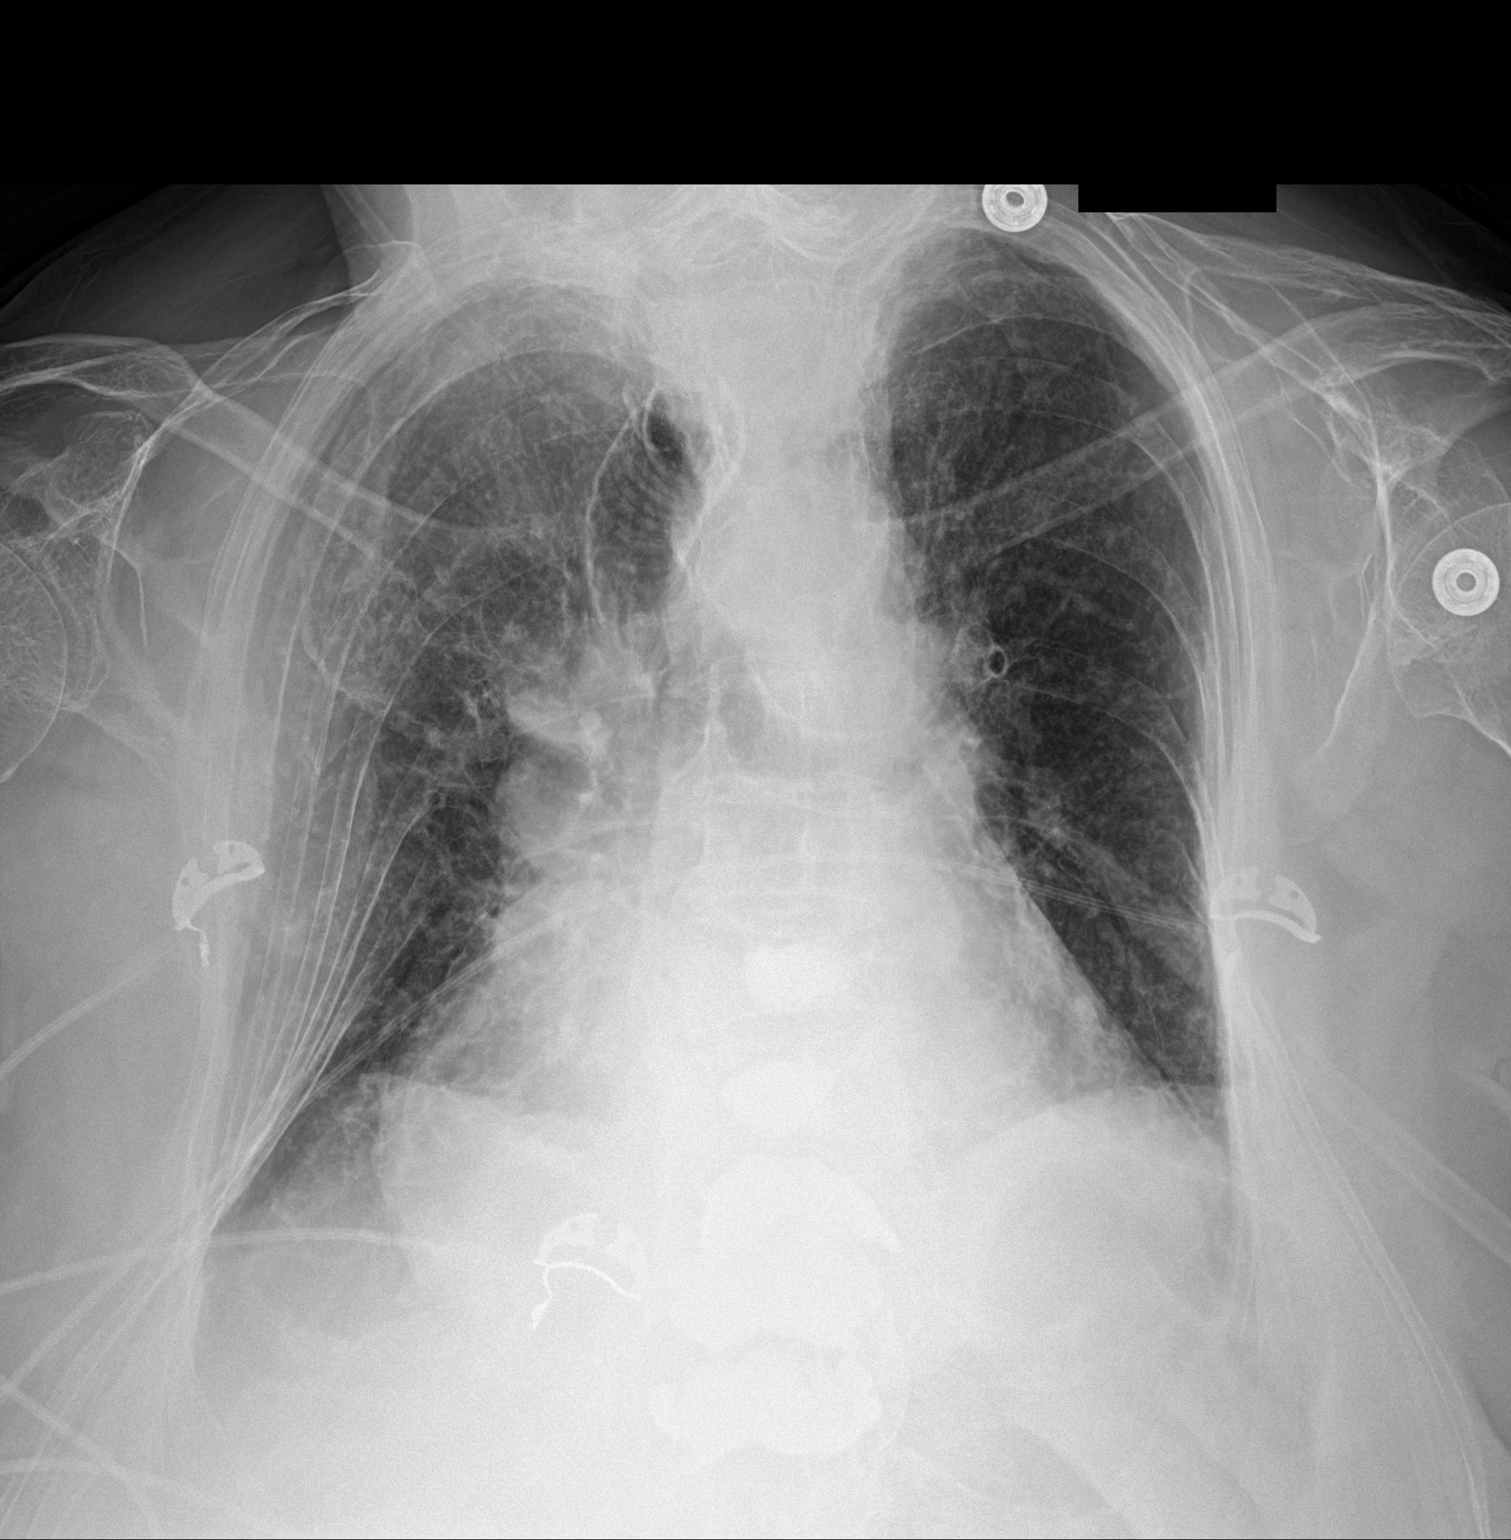

[1 of 1 positions shown; findings below may reference images not displayed]

FINDINGS: The study is limited due to the positioning. No pneumothorax.
Cardiomegaly identified. No pneumothorax. Multilevel
vertebroplasties. No other acute abnormalities.
IMPRESSION: Limited study with no acute abnormality identified.
